# Patient Record
Sex: Female | Born: 1980 | Race: Asian | Hispanic: No | Marital: Married | Smoking: Never smoker
Health system: Southern US, Community
[De-identification: ages and names within clinical notes are randomized; demographics above are authoritative.]

## PROBLEM LIST (undated history)

## (undated) DIAGNOSIS — O019 Hydatidiform mole, unspecified: Secondary | ICD-10-CM

## (undated) DIAGNOSIS — IMO0002 Reserved for concepts with insufficient information to code with codable children: Secondary | ICD-10-CM

## (undated) DIAGNOSIS — C801 Malignant (primary) neoplasm, unspecified: Secondary | ICD-10-CM

## (undated) HISTORY — PX: CLEFT LIP REPAIR: SUR1164

## (undated) HISTORY — DX: Malignant (primary) neoplasm, unspecified: C80.1

## (undated) HISTORY — DX: Reserved for concepts with insufficient information to code with codable children: IMO0002

## (undated) HISTORY — PX: PORTACATH PLACEMENT: SHX2246

---

## 2010-09-22 DIAGNOSIS — C801 Malignant (primary) neoplasm, unspecified: Secondary | ICD-10-CM

## 2010-09-22 DIAGNOSIS — R87619 Unspecified abnormal cytological findings in specimens from cervix uteri: Secondary | ICD-10-CM

## 2010-09-22 DIAGNOSIS — IMO0002 Reserved for concepts with insufficient information to code with codable children: Secondary | ICD-10-CM

## 2010-09-22 HISTORY — DX: Unspecified abnormal cytological findings in specimens from cervix uteri: R87.619

## 2010-09-22 HISTORY — DX: Reserved for concepts with insufficient information to code with codable children: IMO0002

## 2010-09-22 HISTORY — PX: DILATION AND EVACUATION: SHX1459

## 2010-09-22 HISTORY — DX: Malignant (primary) neoplasm, unspecified: C80.1

## 2011-01-22 ENCOUNTER — Ambulatory Visit (HOSPITAL_COMMUNITY)
Admission: RE | Admit: 2011-01-22 | Discharge: 2011-01-22 | Disposition: A | Discharge status: Home / Self Care | Payer: PRIVATE HEALTH INSURANCE | Source: Ambulatory Visit | Attending: Obstetrics and Gynecology | Admitting: Obstetrics and Gynecology

## 2011-01-23 ENCOUNTER — Other Ambulatory Visit: Payer: Self-pay | Admitting: Obstetrics and Gynecology

## 2011-01-23 ENCOUNTER — Ambulatory Visit (HOSPITAL_COMMUNITY)
Admission: RE | Admit: 2011-01-23 | Discharge: 2011-01-23 | Disposition: A | Discharge status: Home / Self Care | Payer: PRIVATE HEALTH INSURANCE | Source: Ambulatory Visit | Attending: Obstetrics and Gynecology | Admitting: Obstetrics and Gynecology

## 2011-01-23 DIAGNOSIS — O019 Hydatidiform mole, unspecified: Secondary | ICD-10-CM | POA: Insufficient documentation

## 2011-01-30 NOTE — Op Note (Signed)
[name], [name]                   ACCOUNT NO.:  [id number]  MEDICAL RECORD NO.:  [id number]           PATIENT TYPE:  O  LOCATION:  [facility]                          [facility]  PHYSICIAN:  Janine Limbo, M.D.DATE OF BIRTH:  05-08-1981  DATE OF PROCEDURE:  01/23/2011 DATE OF DISCHARGE:                              OPERATIVE REPORT   PREOPERATIVE DIAGNOSIS:  Benign gestational trophoblastic neoplasia.  POSTOPERATIVE DIAGNOSIS:  Benign gestational trophoblastic neoplasia.  PROCEDURE:  Suction dilatation and evacuation.  SURGEON:  Janine Limbo, MD  FIRST ASSISTANT:  None.  ANESTHETIC:  Monitored anesthetic control and paracervical block using 0.5% Marcaine with epinephrine.  DISPOSITION:  [name] is a 30 year old female, gravida 1, para 0, who presents at 6 weeks' gestation ([facility] is August 07, 2011).  The patient has been followed at the [facility] and Gynecology Division of [facility].  The patient presented for her first exam, complaining of spotting of 1 week's duration.  An ultrasound was performed that confirmed what appeared to be a molar pregnancy.  The patient understands the indications for her surgical procedure and she accepts the risk of, but not limited to, anesthetic complications, bleeding, infections, and possible damage to the surrounding organs.  FINDINGS:  The uterus was approximately 12-week size.  A large amount of tissue was removed.  No adnexal masses were appreciated.  After the procedure was completed, the uterus was noted to be approximately 6-8 weeks size and firm.  The patient's preoperative hemoglobin was 12.7. Her hematocrit was 38.4%.  Her white blood cell count was 10,600.  Her platelet count was 226,000.  Her blood type is B positive.  Her quantitative beta HCG is 261,077.  DESCRIPTION OF PROCEDURE:  The patient was taken to the operating room where she was given medication through her IV  line.  She was placed in a lithotomy position.  The patient's lower abdomen, perineum, and vagina were prepped with multiple layers of Betadine.  The bladder was drained of urine.  The patient was then sterilely draped.  An examination under anesthesia was performed with findings as mentioned above.  A speculum was placed in the vagina.  A paracervical block was placed using 10 mL of 0.5% Marcaine with epinephrine.  The uterus sounded to 8.5 cm.  The cervix was gently dilated.  The uterine cavity was then evacuated using a size 8 suction curette followed by medium sharp curette.  The cavity was felt to be completely evacuated at the end of our procedure. Hemostasis was adequate.  The patient was given 15 mg of Toradol intramuscularly and 15 mg of Toradol intravenously.  Needle and sponge counts were correct.  The patient was returned to the supine position. Her anesthetic was reversed and the patient was transported to the recovery room in stable condition.  She tolerated her procedure well. The uterine contents were sent to Pathology for evaluation.  FOLLOWUP INSTRUCTIONS:  The patient will return to see Dr. Stefano Gaul in 1 week for followup examination.  A quantitative HCG will be obtained at that point.  The patient  will see Dr. Stefano Gaul on a weekly basis until her HCG value is negative.  The patient was given a copy of the followup information for patients who have undergone a dilatation and evacuation (prepared by the [facility]).  She will call for questions or concerns.  She was given a prescription for: 1. Motrin 800 mg every 8 hours as needed for mild-to-moderate pain. 2. Vicodin 1 or 2 tablets every 4-6 hours as needed for severe pain. 3. Phenergan 25 mg 1 tablet every 6 hours as needed for nausea.     Janine Limbo, M.D.     AVS/MEDQ  D:  01/23/2011  T:  01/24/2011  Job:  [id number]  Electronically Signed by Kirkland Hun M.D. on 01/30/2011  03:22:39 AM

## 2011-02-14 ENCOUNTER — Other Ambulatory Visit: Payer: Self-pay | Admitting: Obstetrics and Gynecology

## 2011-02-14 ENCOUNTER — Other Ambulatory Visit (HOSPITAL_COMMUNITY): Payer: Self-pay | Admitting: Obstetrics and Gynecology

## 2011-02-14 DIAGNOSIS — D496 Neoplasm of unspecified behavior of brain: Secondary | ICD-10-CM

## 2011-02-15 ENCOUNTER — Inpatient Hospital Stay (HOSPITAL_COMMUNITY)
Admission: AD | Admit: 2011-02-15 | Discharge: 2011-02-15 | Disposition: A | Discharge status: Home / Self Care | Payer: PRIVATE HEALTH INSURANCE | Source: Ambulatory Visit | Attending: Obstetrics and Gynecology | Admitting: Obstetrics and Gynecology

## 2011-02-15 ENCOUNTER — Other Ambulatory Visit (HOSPITAL_COMMUNITY): Payer: PRIVATE HEALTH INSURANCE

## 2011-02-15 DIAGNOSIS — D649 Anemia, unspecified: Secondary | ICD-10-CM | POA: Insufficient documentation

## 2011-02-15 DIAGNOSIS — R1011 Right upper quadrant pain: Secondary | ICD-10-CM | POA: Insufficient documentation

## 2011-02-15 DIAGNOSIS — O0289 Other abnormal products of conception: Secondary | ICD-10-CM | POA: Insufficient documentation
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
## 2011-02-18 ENCOUNTER — Other Ambulatory Visit: Payer: Self-pay | Admitting: Oncology

## 2011-02-18 ENCOUNTER — Encounter (HOSPITAL_BASED_OUTPATIENT_CLINIC_OR_DEPARTMENT_OTHER): Payer: PRIVATE HEALTH INSURANCE | Admitting: Oncology

## 2011-02-18 DIAGNOSIS — O019 Hydatidiform mole, unspecified: Secondary | ICD-10-CM

## 2011-02-18 DIAGNOSIS — Z5111 Encounter for antineoplastic chemotherapy: Secondary | ICD-10-CM

## 2011-02-18 LAB — COMPREHENSIVE METABOLIC PANEL
Alkaline Phosphatase: 68 U/L (ref 39–117)
CO2: 22 mEq/L (ref 19–32)
Creatinine, Ser: 0.49 mg/dL (ref 0.40–1.20)
Glucose, Bld: 93 mg/dL (ref 70–99)
Total Bilirubin: 0.4 mg/dL (ref 0.3–1.2)

## 2011-02-18 LAB — CBC WITH DIFFERENTIAL/PLATELET
BASO%: 0.5 % (ref 0.0–2.0)
Eosinophils Absolute: 0.2 10*3/uL (ref 0.0–0.5)
HCT: 34.3 % — ABNORMAL LOW (ref 34.8–46.6)
LYMPH%: 14.9 % (ref 14.0–49.7)
MCHC: 33.4 g/dL (ref 31.5–36.0)
MCV: 85.7 fL (ref 79.5–101.0)
MONO#: 0.5 10*3/uL (ref 0.1–0.9)
MONO%: 5.1 % (ref 0.0–14.0)
NEUT%: 78 % — ABNORMAL HIGH (ref 38.4–76.8)
Platelets: 255 10*3/uL (ref 145–400)
RBC: 4 10*6/uL (ref 3.70–5.45)
WBC: 10.5 10*3/uL — ABNORMAL HIGH (ref 3.9–10.3)

## 2011-02-19 ENCOUNTER — Other Ambulatory Visit: Payer: Self-pay | Admitting: Oncology

## 2011-02-19 DIAGNOSIS — O019 Hydatidiform mole, unspecified: Secondary | ICD-10-CM

## 2011-02-20 LAB — BETA HCG QUANT (REF LAB): Beta hCG, Tumor Marker: 399713 m[IU]/mL — ABNORMAL HIGH (ref <5.0)

## 2011-02-21 ENCOUNTER — Ambulatory Visit (HOSPITAL_COMMUNITY)
Admission: RE | Admit: 2011-02-21 | Discharge: 2011-02-21 | Disposition: A | Discharge status: Home / Self Care | Payer: PRIVATE HEALTH INSURANCE | Source: Ambulatory Visit | Attending: Oncology | Admitting: Oncology

## 2011-02-21 ENCOUNTER — Encounter (HOSPITAL_COMMUNITY): Payer: Self-pay

## 2011-02-21 DIAGNOSIS — J984 Other disorders of lung: Secondary | ICD-10-CM | POA: Insufficient documentation

## 2011-02-21 DIAGNOSIS — D649 Anemia, unspecified: Secondary | ICD-10-CM | POA: Insufficient documentation

## 2011-02-21 DIAGNOSIS — Z79899 Other long term (current) drug therapy: Secondary | ICD-10-CM | POA: Insufficient documentation

## 2011-02-21 DIAGNOSIS — K7689 Other specified diseases of liver: Secondary | ICD-10-CM | POA: Insufficient documentation

## 2011-02-21 DIAGNOSIS — I2699 Other pulmonary embolism without acute cor pulmonale: Secondary | ICD-10-CM | POA: Insufficient documentation

## 2011-02-21 DIAGNOSIS — R112 Nausea with vomiting, unspecified: Secondary | ICD-10-CM | POA: Insufficient documentation

## 2011-02-21 DIAGNOSIS — O019 Hydatidiform mole, unspecified: Secondary | ICD-10-CM | POA: Insufficient documentation

## 2011-02-21 DIAGNOSIS — O0289 Other abnormal products of conception: Secondary | ICD-10-CM | POA: Insufficient documentation

## 2011-02-21 DIAGNOSIS — C7800 Secondary malignant neoplasm of unspecified lung: Secondary | ICD-10-CM | POA: Insufficient documentation

## 2011-02-21 HISTORY — DX: Hydatidiform mole, unspecified: O01.9

## 2011-02-21 MED ORDER — IOHEXOL 300 MG/ML  SOLN
100.0000 mL | Freq: Once | INTRAMUSCULAR | Status: AC | PRN
  Administered 2011-02-21: 100 mL via INTRAVENOUS

## 2011-02-26 ENCOUNTER — Encounter (HOSPITAL_BASED_OUTPATIENT_CLINIC_OR_DEPARTMENT_OTHER): Payer: PRIVATE HEALTH INSURANCE | Admitting: Oncology

## 2011-02-26 ENCOUNTER — Other Ambulatory Visit: Payer: Self-pay | Admitting: Oncology

## 2011-02-26 ENCOUNTER — Inpatient Hospital Stay (HOSPITAL_COMMUNITY)
Admission: AD | Admit: 2011-02-26 | Discharge: 2011-02-28 | DRG: 846 | Disposition: A | Discharge status: Home / Self Care | Payer: PRIVATE HEALTH INSURANCE | Source: Ambulatory Visit | Attending: Oncology | Admitting: Oncology

## 2011-02-26 DIAGNOSIS — O020 Blighted ovum and nonhydatidiform mole: Secondary | ICD-10-CM

## 2011-02-26 DIAGNOSIS — O019 Hydatidiform mole, unspecified: Secondary | ICD-10-CM

## 2011-02-26 DIAGNOSIS — Z5111 Encounter for antineoplastic chemotherapy: Principal | ICD-10-CM

## 2011-02-26 DIAGNOSIS — C7800 Secondary malignant neoplasm of unspecified lung: Secondary | ICD-10-CM | POA: Diagnosis present

## 2011-02-26 DIAGNOSIS — I2699 Other pulmonary embolism without acute cor pulmonale: Secondary | ICD-10-CM | POA: Diagnosis present

## 2011-02-26 DIAGNOSIS — C55 Malignant neoplasm of uterus, part unspecified: Secondary | ICD-10-CM | POA: Diagnosis present

## 2011-02-26 LAB — CBC WITH DIFFERENTIAL/PLATELET
BASO%: 0.4 % (ref 0.0–2.0)
Basophils Absolute: 0 10*3/uL (ref 0.0–0.1)
Eosinophils Absolute: 0.1 10*3/uL (ref 0.0–0.5)
HCT: 33.9 % — ABNORMAL LOW (ref 34.8–46.6)
HGB: 11.4 g/dL — ABNORMAL LOW (ref 11.6–15.9)
MONO#: 0.4 10*3/uL (ref 0.1–0.9)
NEUT#: 6.4 10*3/uL (ref 1.5–6.5)
NEUT%: 78.5 % — ABNORMAL HIGH (ref 38.4–76.8)
Platelets: 214 10*3/uL (ref 145–400)
WBC: 8.2 10*3/uL (ref 3.9–10.3)
lymph#: 1.3 10*3/uL (ref 0.9–3.3)

## 2011-02-27 LAB — DIFFERENTIAL
Eosinophils Relative: 2 % (ref 0–5)
Lymphocytes Relative: 25 % (ref 12–46)
Lymphs Abs: 2 10*3/uL (ref 0.7–4.0)
Monocytes Absolute: 0.5 10*3/uL (ref 0.1–1.0)
Monocytes Relative: 7 % (ref 3–12)

## 2011-02-27 LAB — CBC
HCT: 30.8 % — ABNORMAL LOW (ref 36.0–46.0)
MCH: 27.6 pg (ref 26.0–34.0)
MCV: 83.2 fL (ref 78.0–100.0)
RDW: 12.5 % (ref 11.5–15.5)
WBC: 8.1 10*3/uL (ref 4.0–10.5)

## 2011-02-27 LAB — COMPREHENSIVE METABOLIC PANEL
Alkaline Phosphatase: 59 U/L (ref 39–117)
BUN: 6 mg/dL (ref 6–23)
Glucose, Bld: 88 mg/dL (ref 70–99)
Potassium: 3.7 mEq/L (ref 3.5–5.1)
Total Bilirubin: 0.2 mg/dL — ABNORMAL LOW (ref 0.3–1.2)
Total Protein: 6.2 g/dL (ref 6.0–8.3)

## 2011-02-27 LAB — URINALYSIS, ROUTINE W REFLEX MICROSCOPIC
Glucose, UA: NEGATIVE mg/dL
Protein, ur: NEGATIVE mg/dL
Urobilinogen, UA: 0.2 mg/dL (ref 0.0–1.0)

## 2011-02-27 LAB — URINE MICROSCOPIC-ADD ON

## 2011-02-28 LAB — CBC
MCH: 27.5 pg (ref 26.0–34.0)
MCV: 81.7 fL (ref 78.0–100.0)
Platelets: 226 10*3/uL (ref 150–400)
RDW: 12.4 % (ref 11.5–15.5)

## 2011-02-28 LAB — DIFFERENTIAL
Eosinophils Absolute: 0 10*3/uL (ref 0.0–0.7)
Eosinophils Relative: 0 % (ref 0–5)
Lymphs Abs: 0.8 10*3/uL (ref 0.7–4.0)
Monocytes Relative: 4 % (ref 3–12)

## 2011-03-02 NOTE — Discharge Summary (Signed)
[name], [name]                   ACCOUNT NO.:  [id number]  MEDICAL RECORD NO.:  [id number]  LOCATION:  1309                         FACILITY:  [facility]  PHYSICIAN:  Lowella Dell, M.D.DATE OF BIRTH:  May 02, 1981  DATE OF ADMISSION:  02/26/2011 DATE OF DISCHARGE:                              DISCHARGE SUMMARY   DISCHARGE DIAGNOSES: 1. Gestational trophoblastic neoplasia, high risk. 2. Poor venous access. 3. Anemia. 4. Mild protein malnutrition. 5. History of remote cleft palate repair.  PROCEDURES:  Intravenous chemotherapy.  HOSPITAL COURSE:  The patient was admitted on June 6 for her first dose of EMA-CO chemotherapy.  She had previously received one dose of methotrexate approximately 10 days prior.  Since that initial dose, CT scans obtained June 1 showed extensive involvement of the lungs.  No involvement of the brain and no obvious involvement in the pelvis.  The situation was discussed with the patient and she has agreed to be treated with standard of care which is the chemotherapy just described. She was admitted on June 6.  Unfortunately, the bed was not available until the evening, so the chemotherapy could not be started until June 7.  That day, she received Zofran 16 mg intravenously followed by Decadron 20 mg intravenously and then etoposide 100 mg per meter squared for a total dose of 140 mg intravenously followed by dactinomycin 0.5 mg, total dose IV push.  All those medications were repeated on day 2. On day 1 in addition, she received methotrexate 100 mg per meter squared for a total dose of 140 mg push, followed by methotrexate 200 mg per meter squared total dose 290 mg IV over 12 hours.  This was completed and approximately 1:30 in the morning of June 8.  The patient will start her folinic acid 15 mg at 1:30 p.m. of June 8 and she will repeat it at 10 p.m. that day and then the next day at 2 p.m. and 10 p.m. and then have a final dose on June 10 at 6  a.m.  The patient has tolerated treatment well.  There has been no phlebitis, nausea, vomiting or weakness.  The patient's only complaint is frequent urination secondary to the intravenous fluids.  Vital signs were stable through the hospitalization and at the time of this dictation, the patient's temperature is 98.2, pulse 73, respiratory rate 16 and blood pressure 90/59.  Her room air saturation is 98%.  The patient's CBC this morning shows a white cell count of 10.9, hemoglobin 10.5 and platelets 226,000.  Liver function tests obtained on June 7 were normal, except for slightly low bilirubin which is inconsequential. The creatinine was 0.47 and the electrolytes were normal.  The albumin was low at 2.9 with a calcium of 9.3.  The pretreatment beta HCG had risen to greater than 350,000 on June 8. Repeat beta HCG was already down to 180,000.  CONDITION ON DISCHARGE:  Stable.  DISCHARGE MEDICATIONS:  She will take leucovorin 15 mg as noted above. She will have a prescription for metoclopramide 5 mg to take 3 times a day before meals as needed and she will have a prescription for Emla cream to use  once her port is placed.  Unfortunately, that could not be done before her discharge today because the patient has already had breakfast.  She may also take Advil for pain as needed.  Return to work is as tolerated.  Activity is unrestricted.  Diet is unrestricted.  FOLLOWUP APPOINTMENTS:  The patient has an appointment with [place] at the [facility] on June 13 at 8:30 in the morning for her second part of first cycle of chemotherapy.     Lowella Dell, M.D.     [name]  D:  02/28/2011  T:  02/28/2011  Job:  [id number]  cc:   Janine Limbo, M.D. Fax: [phone]  Laurette Schimke, MD  Electronically Signed by Ruthann Cancer M.D. on 03/02/2011 11:45:58 AM

## 2011-03-02 NOTE — H&P (Signed)
[name]                   ACCOUNT NO.:  [id number]  MEDICAL RECORD NO.:  [id number]  LOCATION:  1309                         FACILITY:  [facility]  PHYSICIAN:  Lowella Dell, M.D.DATE OF BIRTH:  [birth year]  DATE OF ADMISSION:  02/26/2011 DATE OF DISCHARGE:                             HISTORY & PHYSICAL   HISTORY OF PRESENT ILLNESS:  [name] is a 30 year old [place] woman who is originally from [name].  She was referred to Dr. Darnelle Catalan by Dr. Stefano Gaul  after she was found to have a molar pregnancy.  She is status post first trimester dilatation and evacuation on Jan 23, 2011. Pathology [id number]) showed an 11-week molar pregnancy.  Preoperative beta HCG was 261,077.  This was repeated 2 weeks postop and was down to 88,955.  It then, however, began to increase and by May 26 was back up to 236,711.  She was evaluated at the cancer center outpatient clinic by Dr. Darnelle Catalan on Feb 18, 2011 and was actually started on methotrexate and leucovorin.  Subsequent staging scans, however, were obtained on February 21, 2011.  These consisted of a CT of the head, chest, abdomen and pelvis.  CT of the head was unremarkable with no intracranial metastasis.  CT of the chest, however, showed multiple bilateral pulmonary nodules consistent with metastatic gestational trophoblastic disease.  There was a large left hilar nodule, more irregular; also a small acute pulmonary embolism within the right lower lobe although the overall clot burden appeared to be very minimal.  CT of the abdomen and pelvis showed a 3-mm hypodensity within the right hepatic lobe which was too small to characterize.  Of course there was also hyperemic uterus related to the recent pregnancy.  Based on this new information, the decision was made to treat [name] with the EMA-CO protocol and of course this will have to be done on an inpatient basis.  With the exception of some occasional nausea and emesis, primarily  in the mornings, [name] is feeling well and has no additional complaints today.  She has had no vaginal bleeding whatsoever.  ALLERGIES:  No known drug allergies.  CURRENT MEDICATIONS:  None.  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  FAMILY HISTORY:  Noncontributory.  SOCIAL HISTORY:  The patient is an immigrant from [name] who lives here with her husband, her parents, her brother, sister-in-law, and her brother's child.  PHYSICAL EXAMINATION:  GENERAL:  Well-appearing appearing her stated age of 39, in no acute distress. VITAL SIGNS:  Blood pressure 118/81, pulse 94, respirations 20, temperature 98.8, height 58.5 inches and weight 112.9 pounds.  HEENT: Sclerae anicteric.  Conjunctivae pink.  Oropharynx is benign. NECK:  Supple. NODES:  No peripheral lymphadenopathy palpated. BREASTS:  Deferred. LUNGS:  Clear to auscultation bilaterally with no frank crackles, wheezes or rhonchi auscultated. HEART:  Regular rate and rhythm with no gallops, murmurs or rubs. ABDOMEN:  Soft, mildly distended but nontender to palpation.  Positive bowel sounds x4.  No organomegaly or masses palpated. MUSCULOSKELETAL:  No focal spinal tenderness to palpation. EXTREMITIES:  No peripheral edema. SKIN:  Benign. NEURO:  Nonfocal.  The  patient is alert and oriented x3. GENITOURINARY:  Deferred.  REVIEW OF SYSTEMS:  As per HPI.  [name] primary complaint at this time is some continued mild nausea and occasional emesis, primarily in the mornings.  She denies any nausea here in the office today.  She has had no recent fevers, chills, night sweats, rashes or abnormal bleeding.  No vaginal bleeding.  No diarrhea or constipation.  No change in urinary habits.  No cough, phlegm production, pleurisy or shortness of breath. No chest pain or palpitations.  No abnormal headaches or dizziness.  She denies any myalgias, arthralgias bony pain or peripheral  swelling.  She has had no abdominal or pelvic pain.  LABORATORY DATA:  White count 8.2, ANC 6.4, hemoglobin 11.4 and platelets 214,000.  A CMET and beta HCG are both pending.  FILMS/STUDIES:  As noted above, CT of the head, chest, abdomen and pelvis were obtained on February 21, 2011; results were detailed above in HPI.  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  Status post first trimester dilatation and evacuation under the care of Dr. Kirkland Hun on Jan 23, 2011, pathology showing an 11-week molar pregnancy.  There was an initial drop in beta HCG but then a subsequent rise to 236,711 on Feb 15, 2011. Subsequent CTs of the head, chest, abdomen and pelvis on February 21, 2011, confirmed multiple bilateral pulmonary nodules, consistent with metastatic gestational trophoblastic disease.  The patient is now being admitted to [facility] for chemo to be given on inpatient basis.  She will be receiving EMA-C O protocol. On day #1, she will receive etoposide at a dose of 100 mg/sq m IV over 30 minutes; actinomycin D 0.5 mg IVP; and methotrexate at a dose of 100 mg/sq m IVP, then 200 mg/sq m given in 500 mL of D5W over 12 hours.  On day #2, she will receive etoposide 100 mg/sq m IV over 30 minutes; actinomycin D 0.5 mg IVP; and folinic acid 15 mg IM or 15 mg p.o. q.12 h x4 doses beginning 24 hours after the start of methotrexate.  Of note, the patient will be returning to the cancer center on day #8 of each cycle for outpatient chemo consisting of cyclophosphamide at a dose of 600 mg/sq m IV with vincristine 1 mg/sq m IVP.  She will also be receiving premeds on day #1 and #2 consisting of ondansetron 16 mg and dexamethasone 20 mg.  Our plan is to begin [name] chemo tomorrow, February 27, 2011; and hopefully we will be able to discharge her the following  evening on February 28, 2011, and then she will return to see Dr. Darnelle Catalan to initiate her day #8 chemo in the outpatient clinic on March 05, 2011.  This admission and plan has been reviewed with Dr. Darnelle Catalan who voices agreement.     Jillian Allegra Grana, PA-C   ______________________________ Lowella Dell, M.D.    AGB/MEDQ  D:  02/26/2011  T:  02/26/2011  Job:  [id number]  cc:   Laurette Schimke, MD  Janine Limbo, M.D. Fax: [phone]  Electronically Signed by Zollie Scale PA on 02/27/2011 08:46:44 AM Electronically Signed by Ruthann Cancer M.D. on 03/02/2011 11:45:56 AM

## 2011-03-03 LAB — COMPREHENSIVE METABOLIC PANEL
AST: 17 U/L (ref 0–37)
Alkaline Phosphatase: 64 U/L (ref 39–117)
BUN: 10 mg/dL (ref 6–23)
Calcium: 9 mg/dL (ref 8.4–10.5)
Chloride: 107 mEq/L (ref 96–112)
Creatinine, Ser: 0.55 mg/dL (ref 0.50–1.10)

## 2011-03-04 ENCOUNTER — Ambulatory Visit (HOSPITAL_COMMUNITY)
Admit: 2011-03-04 | Discharge: 2011-03-04 | Disposition: A | Discharge status: Home / Self Care | Payer: PRIVATE HEALTH INSURANCE | Attending: Oncology | Admitting: Oncology

## 2011-03-04 ENCOUNTER — Other Ambulatory Visit (HOSPITAL_COMMUNITY): Payer: Self-pay | Admitting: Oncology

## 2011-03-04 ENCOUNTER — Ambulatory Visit (HOSPITAL_COMMUNITY): Admission: RE | Admit: 2011-03-04 | Payer: PRIVATE HEALTH INSURANCE | Source: Ambulatory Visit | Admitting: Oncology

## 2011-03-04 DIAGNOSIS — O020 Blighted ovum and nonhydatidiform mole: Secondary | ICD-10-CM

## 2011-03-04 DIAGNOSIS — Z79899 Other long term (current) drug therapy: Secondary | ICD-10-CM | POA: Insufficient documentation

## 2011-03-04 DIAGNOSIS — O0289 Other abnormal products of conception: Secondary | ICD-10-CM | POA: Insufficient documentation

## 2011-03-05 ENCOUNTER — Encounter (HOSPITAL_BASED_OUTPATIENT_CLINIC_OR_DEPARTMENT_OTHER): Payer: PRIVATE HEALTH INSURANCE | Admitting: Oncology

## 2011-03-05 ENCOUNTER — Other Ambulatory Visit: Payer: Self-pay | Admitting: Oncology

## 2011-03-05 DIAGNOSIS — O019 Hydatidiform mole, unspecified: Secondary | ICD-10-CM

## 2011-03-05 DIAGNOSIS — Z5111 Encounter for antineoplastic chemotherapy: Secondary | ICD-10-CM

## 2011-03-05 LAB — CBC WITH DIFFERENTIAL/PLATELET
BASO%: 0.3 % (ref 0.0–2.0)
Basophils Absolute: 0 10*3/uL (ref 0.0–0.1)
HCT: 32.2 % — ABNORMAL LOW (ref 34.8–46.6)
HGB: 10.9 g/dL — ABNORMAL LOW (ref 11.6–15.9)
LYMPH%: 12.9 % — ABNORMAL LOW (ref 14.0–49.7)
MCHC: 33.8 g/dL (ref 31.5–36.0)
MONO#: 0 10*3/uL — ABNORMAL LOW (ref 0.1–0.9)
NEUT%: 85.5 % — ABNORMAL HIGH (ref 38.4–76.8)
Platelets: 196 10*3/uL (ref 145–400)
WBC: 7.2 10*3/uL (ref 3.9–10.3)

## 2011-03-08 LAB — COMPREHENSIVE METABOLIC PANEL
ALT: 16 U/L (ref 0–35)
BUN: 14 mg/dL (ref 6–23)
CO2: 24 mEq/L (ref 19–32)
Calcium: 9.6 mg/dL (ref 8.4–10.5)
Creatinine, Ser: 0.62 mg/dL (ref 0.50–1.10)
Glucose, Bld: 101 mg/dL — ABNORMAL HIGH (ref 70–99)
Total Bilirubin: 0.7 mg/dL (ref 0.3–1.2)

## 2011-03-08 LAB — BETA HCG QUANT (REF LAB): Beta hCG, Tumor Marker: 199560 m[IU]/mL — ABNORMAL HIGH (ref <5.0)

## 2011-03-12 ENCOUNTER — Other Ambulatory Visit: Payer: Self-pay | Admitting: Oncology

## 2011-03-12 ENCOUNTER — Encounter (HOSPITAL_BASED_OUTPATIENT_CLINIC_OR_DEPARTMENT_OTHER): Payer: PRIVATE HEALTH INSURANCE | Admitting: Oncology

## 2011-03-12 ENCOUNTER — Inpatient Hospital Stay (HOSPITAL_COMMUNITY)
Admission: AD | Admit: 2011-03-12 | Discharge: 2011-03-13 | DRG: 847 | Disposition: A | Discharge status: Home / Self Care | Payer: PRIVATE HEALTH INSURANCE | Source: Ambulatory Visit | Attending: Oncology | Admitting: Oncology

## 2011-03-12 DIAGNOSIS — C58 Malignant neoplasm of placenta: Secondary | ICD-10-CM | POA: Diagnosis present

## 2011-03-12 DIAGNOSIS — Z5111 Encounter for antineoplastic chemotherapy: Principal | ICD-10-CM

## 2011-03-12 DIAGNOSIS — D759 Disease of blood and blood-forming organs, unspecified: Secondary | ICD-10-CM | POA: Diagnosis present

## 2011-03-12 DIAGNOSIS — O0289 Other abnormal products of conception: Secondary | ICD-10-CM

## 2011-03-12 DIAGNOSIS — O019 Hydatidiform mole, unspecified: Secondary | ICD-10-CM

## 2011-03-12 DIAGNOSIS — D649 Anemia, unspecified: Secondary | ICD-10-CM | POA: Diagnosis present

## 2011-03-12 DIAGNOSIS — C7800 Secondary malignant neoplasm of unspecified lung: Secondary | ICD-10-CM | POA: Diagnosis present

## 2011-03-12 LAB — CBC WITH DIFFERENTIAL/PLATELET
BASO%: 0.6 % (ref 0.0–2.0)
EOS%: 3.4 % (ref 0.0–7.0)
MCH: 28.3 pg (ref 25.1–34.0)
MCHC: 34.2 g/dL (ref 31.5–36.0)
MONO#: 0.3 10*3/uL (ref 0.1–0.9)
RDW: 11.8 % (ref 11.2–14.5)
WBC: 2.6 10*3/uL — ABNORMAL LOW (ref 3.9–10.3)
lymph#: 0.9 10*3/uL (ref 0.9–3.3)

## 2011-03-12 LAB — URINALYSIS, ROUTINE W REFLEX MICROSCOPIC
Bilirubin Urine: NEGATIVE
Ketones, ur: NEGATIVE mg/dL
Nitrite: NEGATIVE
Protein, ur: NEGATIVE mg/dL
Urobilinogen, UA: 1 mg/dL (ref 0.0–1.0)
pH: 6.5 (ref 5.0–8.0)

## 2011-03-12 LAB — COMPREHENSIVE METABOLIC PANEL
ALT: 40 U/L — ABNORMAL HIGH (ref 0–35)
AST: 19 U/L (ref 0–37)
Albumin: 3.6 g/dL (ref 3.5–5.2)
CO2: 25 mEq/L (ref 19–32)
Calcium: 9.4 mg/dL (ref 8.4–10.5)
Chloride: 99 mEq/L (ref 96–112)
Potassium: 3.2 mEq/L — ABNORMAL LOW (ref 3.5–5.3)
Sodium: 133 mEq/L — ABNORMAL LOW (ref 135–145)
Total Protein: 6.5 g/dL (ref 6.0–8.3)

## 2011-03-12 LAB — URINE MICROSCOPIC-ADD ON

## 2011-03-13 LAB — DIFFERENTIAL
Eosinophils Relative: 0 % (ref 0–5)
Lymphocytes Relative: 26 % (ref 12–46)
Lymphs Abs: 0.4 10*3/uL — ABNORMAL LOW (ref 0.7–4.0)
Monocytes Absolute: 0 10*3/uL — ABNORMAL LOW (ref 0.1–1.0)

## 2011-03-13 LAB — CBC
HCT: 27.5 % — ABNORMAL LOW (ref 36.0–46.0)
MCHC: 33.1 g/dL (ref 30.0–36.0)
MCV: 82.1 fL (ref 78.0–100.0)
RDW: 12 % (ref 11.5–15.5)
WBC: 1.6 10*3/uL — ABNORMAL LOW (ref 4.0–10.5)

## 2011-03-13 NOTE — H&P (Signed)
NAMEPATRIZIA, [name]                   ACCOUNT NO.:  [id number]  MEDICAL RECORD NO.:  [id number]  LOCATION:  1342                         FACILITY:  [facility]  PHYSICIAN:  Lowella Dell, M.D.DATE OF BIRTH:  04-Dec-1980  DATE OF ADMISSION:  03/12/2011 DATE OF DISCHARGE:                             HISTORY & PHYSICAL   HISTORY OF PRESENT ILLNESS:  [name] is a 30 year old woman from [place], currently residing in [place], who was referred to Dr. Darnelle Catalan by Dr. Stefano Gaul with the diagnosis of molar pregnancy.  She underwent a first trimester dilatation and evacuation on Jan 23, 2011 with pathology ([id number]) showing an 11-week molar pregnancy. Preoperative beta HCG was 261077, most recently down to 199560 on March 05, 2011.  Following evaluation by Dr. Darnelle Catalan on Feb 18, 2011, patient was started on methotrexate and leucovorin.  Staging scans, however, on February 21, 2011 showed evidence of multiple bilateral pulmonary nodules consistent with metastatic gestational trophoblastic disease.  [name] is currently receiving EMA - CO given on an inpatient basis.  She is due for her second cycle to begin today.  Overall, [name] tells me she is doing extremely well with the exception of some mild fatigue and some mild back pain, which is intermittent. She has had no recent fevers or chills.  No nausea or emesis.  No signs of abnormal bleeding, specifically no vaginal bleeding.  No abdominal or pelvic pain noted.  No shortness of breath, cough, phlegm production or pleurisy.  ALLERGIES:  No known drug allergies.  CURRENT MEDICATIONS:  Advil as needed for pain.  PAST MEDICAL HISTORY: 1. Gestational trophoblastic neoplasia, high risk, as per HPI. 2. Anemia. 3. History of protein malnutrition. 4. History of remote cleft palate repair.  FAMILY HISTORY:  Noncontributory.  SOCIAL HISTORY:  Patient is an immigrant from [place], currently residing in [place] with her husband,  parents, brother, sister-in-law and her brother's child.  PHYSICAL EXAMINATION:  GENERAL:  Well-appearing female appearing her stated age of 46, in no acute distress. VITAL SIGNS:  Blood pressure 116/73, pulse 116, respirations 20, temperature 99.0, weight 112.9, height 58.5 inches. HEENT:  Sclerae anicteric.  Conjunctivae pink.  Oropharynx clear with no mucositis or candidiasis noted. NECK:  Supple.  Trachea midline.  Notes no peripheral lymphadenopathy. BREAST EXAMINATION:  Deferred. LUNGS:  Clear to auscultation bilaterally.  No crackles, wheezes or rhonchi. HEART:  Regular rate and rhythm. ABDOMEN:  Soft, nontender to palpation with normal bowel sounds.  No organomegaly or masses palpated. GENITOURINARY:  Deferred. MUSCULOSKELETAL:  No focal spinal tenderness to vigorous palpation and percussion. EXTREMITIES:  No peripheral edema. SKIN:  Benign. NEUROLOGIC:  Nonfocal.  Patient alert and oriented x3.  REVIEW OF SYSTEMS:  As per HPI, Ms. Abe only complains of currently some mild fatigue and mild lower back pain.  She denies any fevers, chills, night sweats, rashes or abnormal bleeding, specifically no vaginal bleeding.  No nausea, emesis, diarrhea or constipation.  No change in urinary habits.  No cough, phlegm production, pleurisy, shortness of breath, chest pain or palpitations.  No abnormal headaches, dizziness or change in vision other than mild back pain, which is intermittent.  No additional myalgias, arthralgias or bony pain.  No peripheral swelling. No peripheral neuropathy.  No abdominal or pelvic pain.  LABORATORY DATA:  White count today 2.6, ANC 1.3, hemoglobin 9.6, hematocrit 28.2, MCV 82.9 and platelets 235,000.  A CMET showed a low sodium of 133, normal chloride of 99, low potassium of 3.2, nonfasting glucose 113, normal serum creatinine of 0.57 with normal BUN of 12, total bilirubin slightly low at 0.2, ALT elevated at 40 with a normal AST of 19 and normal  alkaline phosphatase of 66, total protein normal at 6.5, total albumin normal at 3.6 and total calcium normal at 9.40.  the beta HCG is currently pending.  IMPRESSION AND PLAN:  [name] is a 30 year old [place] woman originally from [place] with a diagnosis of high risk gestational trophoblastic neoplasia with evidence of spread to the lungs, status post first trimester dilatation and evacuation in the care of Dr. Kirkland Hun on Jan 23, 2011 with pathology showing an 11-week molar pregnancy.  There was an initial drop in beta HCG, but then a subsequent rise to 236,711 on Feb 15, 2011.  Subsequent CTs of the head, chest, abdomen and pelvis on February 21, 2011 confirmed multiple bilateral pulmonary nodules consistent with metastatic gestational trophoblastic disease.  Patient is now being treated with EMA - CO protocol, due for day I cycle II today given on an inpatient basis.  Patient is being admitted to [facility] for her second cycle of EMA - CO.  On day 1, she will receive etoposide at a dose of 100 mg per meter square intravenous over 30 minutes, actinomycin D 0.5 mg IVP and methotrexate at a dose of 100 mg per meter square IVP, then 200 mg per meter square given in 500 mL of D5W over 12 hours.  On day 2, she will receive etoposide at 100 mg per square meter intravenous over 30 minutes, actinomycin D 0.5 mg IVP and folinic acid 15 mg IM or 15 mg p.o. q.12h. x4 doses beginning 24 hours after the start of methotrexate.  Patient's plan is to return to the cancer center on day 8, March 19, 2011 for outpatient chemo, which will consist of cyclophosphamide at 600 mg per meter square intravenous with vincristine 1 mg per meter square IVP in addition.  The long-term goal is to bring the beta HCG to normal range then continue treatment for an additional 6 weeks at which point we would be given observation.  This case admission and plan were all reviewed with Dr. Ruthann Cancer, who will be following the patient during admission.     Jillian Allegra Grana, PA-C   ______________________________ [place] Dell, M.D.    AGB/MEDQ  D:  03/12/2011  T:  03/12/2011  Job:  [id number]  Electronically Signed by Jillian BERRY PA on 03/12/2011 04:21:47 PM Electronically Signed by Ruthann Cancer M.D. on 03/13/2011 08:19:57 AM

## 2011-03-19 ENCOUNTER — Encounter (HOSPITAL_BASED_OUTPATIENT_CLINIC_OR_DEPARTMENT_OTHER): Payer: PRIVATE HEALTH INSURANCE | Admitting: Oncology

## 2011-03-19 ENCOUNTER — Other Ambulatory Visit: Payer: Self-pay | Admitting: Oncology

## 2011-03-19 DIAGNOSIS — O019 Hydatidiform mole, unspecified: Secondary | ICD-10-CM

## 2011-03-19 DIAGNOSIS — Z5111 Encounter for antineoplastic chemotherapy: Secondary | ICD-10-CM

## 2011-03-19 LAB — COMPREHENSIVE METABOLIC PANEL
BUN: 9 mg/dL (ref 6–23)
CO2: 25 mEq/L (ref 19–32)
Creatinine, Ser: <0.47 mg/dL — ABNORMAL LOW (ref 0.50–1.10)
Glucose, Bld: 124 mg/dL — ABNORMAL HIGH (ref 70–99)
Total Bilirubin: 0.3 mg/dL (ref 0.3–1.2)

## 2011-03-19 LAB — CBC WITH DIFFERENTIAL/PLATELET
Eosinophils Absolute: 0.1 10*3/uL (ref 0.0–0.5)
LYMPH%: 38.9 % (ref 14.0–49.7)
MCV: 81.2 fL (ref 79.5–101.0)
MONO%: 6.6 % (ref 0.0–14.0)
NEUT#: 1.2 10*3/uL — ABNORMAL LOW (ref 1.5–6.5)
NEUT%: 50.6 % (ref 38.4–76.8)
Platelets: 266 10*3/uL (ref 145–400)
RBC: 3.56 10*6/uL — ABNORMAL LOW (ref 3.70–5.45)
nRBC: 0 % (ref 0–0)

## 2011-03-22 LAB — BETA HCG QUANT (REF LAB): Beta hCG, Tumor Marker: 2477.7 m[IU]/mL — ABNORMAL HIGH (ref <5.0)

## 2011-03-25 ENCOUNTER — Inpatient Hospital Stay (HOSPITAL_COMMUNITY)
Admission: AD | Admit: 2011-03-25 | Discharge: 2011-03-27 | DRG: 847 | Disposition: A | Discharge status: Home / Self Care | Payer: PRIVATE HEALTH INSURANCE | Source: Ambulatory Visit | Attending: Oncology | Admitting: Oncology

## 2011-03-25 ENCOUNTER — Other Ambulatory Visit: Payer: Self-pay | Admitting: Oncology

## 2011-03-25 ENCOUNTER — Encounter (HOSPITAL_BASED_OUTPATIENT_CLINIC_OR_DEPARTMENT_OTHER): Payer: PRIVATE HEALTH INSURANCE | Admitting: Oncology

## 2011-03-25 DIAGNOSIS — C7800 Secondary malignant neoplasm of unspecified lung: Secondary | ICD-10-CM | POA: Diagnosis present

## 2011-03-25 DIAGNOSIS — C58 Malignant neoplasm of placenta: Secondary | ICD-10-CM | POA: Diagnosis present

## 2011-03-25 DIAGNOSIS — Z5111 Encounter for antineoplastic chemotherapy: Secondary | ICD-10-CM

## 2011-03-25 DIAGNOSIS — O0289 Other abnormal products of conception: Secondary | ICD-10-CM

## 2011-03-25 DIAGNOSIS — D72819 Decreased white blood cell count, unspecified: Secondary | ICD-10-CM | POA: Diagnosis not present

## 2011-03-25 DIAGNOSIS — O019 Hydatidiform mole, unspecified: Secondary | ICD-10-CM

## 2011-03-25 DIAGNOSIS — E876 Hypokalemia: Secondary | ICD-10-CM | POA: Diagnosis not present

## 2011-03-25 DIAGNOSIS — T451X5A Adverse effect of antineoplastic and immunosuppressive drugs, initial encounter: Secondary | ICD-10-CM | POA: Diagnosis not present

## 2011-03-25 DIAGNOSIS — D6481 Anemia due to antineoplastic chemotherapy: Secondary | ICD-10-CM | POA: Diagnosis not present

## 2011-03-25 LAB — CBC WITH DIFFERENTIAL/PLATELET
Basophils Absolute: 0.1 10*3/uL (ref 0.0–0.1)
HCT: 25.7 % — ABNORMAL LOW (ref 34.8–46.6)
HGB: 8.5 g/dL — ABNORMAL LOW (ref 11.6–15.9)
MONO#: 0.4 10*3/uL (ref 0.1–0.9)
NEUT#: 1.8 10*3/uL (ref 1.5–6.5)
NEUT%: 59 % (ref 38.4–76.8)
RDW: 12.7 % (ref 11.2–14.5)
WBC: 3.1 10*3/uL — ABNORMAL LOW (ref 3.9–10.3)
lymph#: 0.8 10*3/uL — ABNORMAL LOW (ref 0.9–3.3)

## 2011-03-25 LAB — COMPREHENSIVE METABOLIC PANEL
AST: 34 U/L (ref 0–37)
Albumin: 3.4 g/dL — ABNORMAL LOW (ref 3.5–5.2)
BUN: 10 mg/dL (ref 6–23)
Calcium: 9.5 mg/dL (ref 8.4–10.5)
Chloride: 103 mEq/L (ref 96–112)
Glucose, Bld: 124 mg/dL — ABNORMAL HIGH (ref 70–99)
Potassium: 3 mEq/L — ABNORMAL LOW (ref 3.5–5.3)
Sodium: 138 mEq/L (ref 135–145)
Total Protein: 6.5 g/dL (ref 6.0–8.3)

## 2011-03-26 LAB — URINALYSIS, ROUTINE W REFLEX MICROSCOPIC
Bilirubin Urine: NEGATIVE
Hgb urine dipstick: NEGATIVE
Nitrite: NEGATIVE
Protein, ur: NEGATIVE mg/dL
Specific Gravity, Urine: 1.028 (ref 1.005–1.030)
Urobilinogen, UA: 1 mg/dL (ref 0.0–1.0)

## 2011-03-26 LAB — DIFFERENTIAL
Basophils Absolute: 0.1 10*3/uL (ref 0.0–0.1)
Basophils Relative: 3 % — ABNORMAL HIGH (ref 0–1)
Eosinophils Absolute: 0 10*3/uL (ref 0.0–0.7)
Lymphocytes Relative: 31 % (ref 12–46)
Monocytes Absolute: 0.3 10*3/uL (ref 0.1–1.0)
Neutro Abs: 1.1 10*3/uL — ABNORMAL LOW (ref 1.7–7.7)

## 2011-03-26 LAB — CBC
HCT: 23.4 % — ABNORMAL LOW (ref 36.0–46.0)
Hemoglobin: 7.9 g/dL — ABNORMAL LOW (ref 12.0–15.0)
MCH: 27.8 pg (ref 26.0–34.0)
MCHC: 33.8 g/dL (ref 30.0–36.0)
MCV: 82.4 fL (ref 78.0–100.0)
RDW: 12.6 % (ref 11.5–15.5)

## 2011-03-27 LAB — CBC
Hemoglobin: 7.6 g/dL — ABNORMAL LOW (ref 12.0–15.0)
MCH: 27.4 pg (ref 26.0–34.0)
MCHC: 33.3 g/dL (ref 30.0–36.0)
RDW: 12.8 % (ref 11.5–15.5)

## 2011-03-27 LAB — URINALYSIS, ROUTINE W REFLEX MICROSCOPIC
Bilirubin Urine: NEGATIVE
Hgb urine dipstick: NEGATIVE
Nitrite: NEGATIVE
Protein, ur: NEGATIVE mg/dL
Specific Gravity, Urine: 1.011 (ref 1.005–1.030)
Urobilinogen, UA: 0.2 mg/dL (ref 0.0–1.0)

## 2011-03-27 LAB — TYPE AND SCREEN: ABO/RH(D): B POS

## 2011-03-27 LAB — DIFFERENTIAL
Basophils Absolute: 0 10*3/uL (ref 0.0–0.1)
Basophils Relative: 0 % (ref 0–1)
Eosinophils Absolute: 0 10*3/uL (ref 0.0–0.7)
Eosinophils Relative: 0 % (ref 0–5)
Monocytes Absolute: 0.4 10*3/uL (ref 0.1–1.0)
Monocytes Relative: 15 % — ABNORMAL HIGH (ref 3–12)
Neutro Abs: 1.8 10*3/uL (ref 1.7–7.7)

## 2011-03-27 LAB — BASIC METABOLIC PANEL
Calcium: 8 mg/dL — ABNORMAL LOW (ref 8.4–10.5)
GFR calc Af Amer: >60 mL/min (ref >60)
GFR calc non Af Amer: >60 mL/min (ref >60)
Glucose, Bld: 168 mg/dL — ABNORMAL HIGH (ref 70–99)
Potassium: 3.3 mEq/L — ABNORMAL LOW (ref 3.5–5.1)
Sodium: 135 mEq/L (ref 135–145)

## 2011-03-28 LAB — BETA HCG QUANT (REF LAB): Beta hCG, Tumor Marker: 389.5 m[IU]/mL — ABNORMAL HIGH (ref <5.0)

## 2011-04-02 ENCOUNTER — Encounter (HOSPITAL_BASED_OUTPATIENT_CLINIC_OR_DEPARTMENT_OTHER): Payer: PRIVATE HEALTH INSURANCE | Admitting: Oncology

## 2011-04-02 ENCOUNTER — Other Ambulatory Visit: Payer: Self-pay | Admitting: Physician Assistant

## 2011-04-02 DIAGNOSIS — K1370 Unspecified lesions of oral mucosa: Secondary | ICD-10-CM

## 2011-04-02 DIAGNOSIS — Z5111 Encounter for antineoplastic chemotherapy: Secondary | ICD-10-CM

## 2011-04-02 DIAGNOSIS — L738 Other specified follicular disorders: Secondary | ICD-10-CM

## 2011-04-02 DIAGNOSIS — O019 Hydatidiform mole, unspecified: Secondary | ICD-10-CM

## 2011-04-02 LAB — COMPREHENSIVE METABOLIC PANEL
ALT: 34 U/L (ref 0–35)
AST: 22 U/L (ref 0–37)
Alkaline Phosphatase: 60 U/L (ref 39–117)
Creatinine, Ser: 0.64 mg/dL (ref 0.50–1.10)
Sodium: 137 mEq/L (ref 135–145)
Total Bilirubin: 0.3 mg/dL (ref 0.3–1.2)
Total Protein: 7 g/dL (ref 6.0–8.3)

## 2011-04-02 LAB — CBC WITH DIFFERENTIAL/PLATELET
BASO%: 0.4 % (ref 0.0–2.0)
Basophils Absolute: 0 10*3/uL (ref 0.0–0.1)
EOS%: 2 % (ref 0.0–7.0)
HCT: 26.5 % — ABNORMAL LOW (ref 34.8–46.6)
HGB: 8.8 g/dL — ABNORMAL LOW (ref 11.6–15.9)
MONO#: 0.1 10*3/uL (ref 0.1–0.9)
MONO%: 4.8 % (ref 0.0–14.0)
NEUT%: 65.5 % (ref 38.4–76.8)
Platelets: 229 10*3/uL (ref 145–400)
WBC: 2.5 10*3/uL — ABNORMAL LOW (ref 3.9–10.3)
lymph#: 0.7 10*3/uL — ABNORMAL LOW (ref 0.9–3.3)
nRBC: 0 % (ref 0–0)

## 2011-04-04 NOTE — H&P (Addendum)
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  Ms. Geurin was referred to Dr. Darnelle Catalan by Dr. Stefano Gaul having been found to have a molar pregnancy.  She is status post first trimester dilatation and evacuation on Jan 23, 2011 with pathology showing an 36- week molar pregnancy ([id number]-12 [id number]).  Preoperative beta HCG was 261,077.  Repeated 2 weeks postop, down to 88,955, but then increasing once again.  By Feb 15, 2011, beta HCG was back up to 236,711.  After evaluation by Dr. Darnelle Catalan, she was started on methotrexate and leucovorin.  Subsequent staging scans, however, obtained on February 21, 2011 showed multiple bilateral pulmonary nodules consistent with metastatic gestational trophoblastic disease.  There was a large left hilar nodule more of irregular as well as a small acute pulmonary embolism within the right lower lobe, the overall clot burden appearing to be very minimal.  Based on this information, Ms. Forrer she has been receiving EMA - CO chemotherapy, with day 1 and 2 given as an inpatient, day 8 given as an outpatient at the [facility].  She is status post two cycles of EMA - CO, which she has tolerated extremely well and is due to initiate day 1 cycle III tomorrow, March 26, 2011.  Overall, Alorah is feeling well.  She does have some occasional lower back pain for which she takes ibuprofen occasionally.  She denies any recent nausea or emesis.  She has had no abdominal or pelvic pain.  She denies any  vaginal bleeding and in fact a detailed review of systems is otherwise unremarkable.  ALLERGIES:  No known drug allergies.  PAST MEDICAL HISTORY: 1. 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 2. Remote cleft palate repair. 3. History of protein malnutrition. 4. Anemia, mildly symptomatic.  CURRENT MEDICATIONS:  Ibuprofen as needed for pain.  FAMILY HISTORY:  Noncontributory.  SOCIAL HISTORY:  patient is an immigrant from [place], living here in [place] with her husband, parents, brother, sister-in-law and her brother's child.  PHYSICAL EXAMINATION:  GENERAL:  Well-appearing female, appearing of her stated age of 30 and in no acute distress. VITAL SIGNS:  Blood pressure 118/80, pulse 116, respirations 20, temperature 98.9 and weight 114.7, height is 58.5 inches. HEENT:  Sclerae anicteric.  Conjunctivae pale.  Oropharynx is benign. No mucositis or candidiasis.  Notes no cervical, supraclavicular, axillary or inguinal lymphadenopathy. BREAST EXAMINATION:  Deferred. LUNGS:  Clear to auscultation bilaterally.  No frank crackles, wheezes or rhonchi. HEART:  Regular rate and rhythm with no gallops, murmurs or rubs. ABDOMEN:  Soft, mildly distended, nontender to palpation.  Positive bowel sounds x4 with no hepatomegaly, splenomegaly  or masses palpated. GENITOURINARY:  Deferred. MUSCULOSKELETAL:  No focal spinal tenderness to vigorous palpation. EXTREMITIES:  No peripheral edema. SKIN:  Benign. NEUROLOGIC:  Nonfocal.  Patient is alert and oriented x3.  REVIEW OF SYSTEMS:  As per HPI, Ms. Gorley continues to have some mild fatigue and some occasional back pain.  Otherwise, she denies any fevers, chills, night sweats, rashes or abnormal bleeding. Specifically, she has had no vaginal bleeding.  Currently, no problems with nausea or emesis.  No diarrhea, constipation or change in urinary habits.  No cough, phlegm production,  pleurisy, shortness of breath, chest pain or palpitations.  No abnormal headaches or dizziness.  Other than the mild back pain, no unusual myalgias, arthralgias or bony pain. No peripheral swelling.  LABORATORY DATA:  CBC in the outpatient clinic today shows a white count of 3.1, ANC 1.8, hemoglobin 8.5, hematocrit 25.7, MCV 80.8 and platelets of 208,000.  CMET and beta HCG are both pending.  Most recent beta HCG was down to 2478 on March 19, 2011.  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 Kirkland Hun on Jan 23, 2011 with pathology showing an 11-week molar pregnancy.  There was initial drop in beta HCG then a subsequent rise with subsequent computed tomographies of the head chest, abdomen and pelvis on February 21, 2011 confirming multiple bilateral pulmonary nodules consistent with metastatic gestational trophoblastic disease.  Now, being treated with EMA - CO chemotherapy, day 1 and 2 given as an inpatient, day 8 given in the outpatient setting.  Ms. Vandenberghe is being admitted to [facility], due for day 1 cycle III of EMA - CO to begin tomorrow, March 26, 2011.  On day 1, she will receive:  Etoposide at a dose of 100 mg per meter square intravenously over 30 minutes; actinomycin D 0.5 mg IVP and methotrexate at a dose of 100 mg per meter square IVP, then 200 mg per meter square given at 500 mL of D5W over 12 hours.  On day 2 she will receive: Etoposide 100 mg per meter square intravenously over 30 minutes; actinomycin D 0.5 mg IVP and folinic acid 15 mg IM or 15 mg p.o. q.12h. x4 doses beginning 24 hours after the start of the methotrexate.  Ms. Kneale will return to the [facility] on day 8, April 02, 2011, for outpatient chemo consisting  of cyclophosphamide given at a dose of 600 mg per meter square intravenously with vincristine 1 mg per meter square IVP.  Of course, she will also receive premeds on day 1 and 2 consisting of ondansetron and dexamethasone.  This case, admission and plan were all reviewed with Dr. Darnelle Catalan, who voices understanding in agreement with this plan.     [name], PA   ______________________________ Lowella Dell, M.D.    AGB/MEDQ  D:  03/25/2011  T:  03/25/2011  Job:  [id number]  cc:   Laurette Schimke, MD  Janine Limbo, M.D. Fax: [phone]  Electronically Signed by Zollie Scale PA on 04/04/2011 01:08:29 PM Electronically Signed by Ruthann Cancer M.D. on 04/14/2011 01:17:27 PM

## 2011-04-09 ENCOUNTER — Encounter (HOSPITAL_BASED_OUTPATIENT_CLINIC_OR_DEPARTMENT_OTHER): Payer: PRIVATE HEALTH INSURANCE | Admitting: Oncology

## 2011-04-09 ENCOUNTER — Other Ambulatory Visit: Payer: Self-pay | Admitting: Oncology

## 2011-04-09 DIAGNOSIS — O019 Hydatidiform mole, unspecified: Secondary | ICD-10-CM

## 2011-04-09 DIAGNOSIS — Z5111 Encounter for antineoplastic chemotherapy: Secondary | ICD-10-CM

## 2011-04-09 LAB — CBC WITH DIFFERENTIAL/PLATELET
BASO%: 0.6 % (ref 0.0–2.0)
Basophils Absolute: 0 10*3/uL (ref 0.0–0.1)
EOS%: 2.3 % (ref 0.0–7.0)
HCT: 29.5 % — ABNORMAL LOW (ref 34.8–46.6)
HGB: 9.5 g/dL — ABNORMAL LOW (ref 11.6–15.9)
LYMPH%: 33.9 % (ref 14.0–49.7)
MCH: 27.3 pg (ref 25.1–34.0)
MCHC: 32.2 g/dL (ref 31.5–36.0)
MONO#: 0.5 10*3/uL (ref 0.1–0.9)
NEUT%: 34.4 % — ABNORMAL LOW (ref 38.4–76.8)
Platelets: 202 10*3/uL (ref 145–400)

## 2011-04-09 LAB — COMPREHENSIVE METABOLIC PANEL
AST: 24 U/L (ref 0–37)
Albumin: 3.8 g/dL (ref 3.5–5.2)
BUN: 11 mg/dL (ref 6–23)
Calcium: 9.1 mg/dL (ref 8.4–10.5)
Chloride: 103 mEq/L (ref 96–112)
Creatinine, Ser: 0.72 mg/dL (ref 0.50–1.10)
Glucose, Bld: 126 mg/dL — ABNORMAL HIGH (ref 70–99)
Potassium: 3.5 mEq/L (ref 3.5–5.3)

## 2011-04-14 NOTE — Discharge Summary (Signed)
[name], [name]                   ACCOUNT NO.:  [id number]  MEDICAL RECORD NO.:  [id number]  LOCATION:  1342                         FACILITY:  [facility]  PHYSICIAN:  Lowella Dell, M.D.DATE OF BIRTH:  Apr 10, 1981  DATE OF ADMISSION:  03/12/2011 DATE OF DISCHARGE:  03/13/2011                              DISCHARGE SUMMARY   CONDITION ON DISCHARGE:  Stable.  DISCHARGE DIAGNOSES: 1. History of gestational trophoblastic neoplasia, high risk, admitted     for second cycle of EMA-CO. 2. History of anemia. 3. History of protein-calorie malnutrition. 4. History of remote cleft palate repair.  LABORATORY DATA ON DISCHARGE:  White count 1.6, hemoglobin 9.1, hematocrit 27.5, MCV 82.1, platelet count 242,000, ANC 1.1, lymphocytes 0.4, monocytes 0.  UA is showing slight trace of blood, and small leukocytes.  DISCHARGE MEDICATIONS: 1. Advil 1-2 tablets p.o. q.8 hours p.r.n. 2. EMLA apply 1 hour before going into port, daily as needed. 3. Leucovorin calcium 10 mg tablets 1-1/2 tablets 3 times a day for 2     days are directed. 4. Reglan 5 mg tablets p.o. before meals at bedtime p.r.n.  PROCEDURES:  Chemotherapy on March 12, 2011 initiation as follows: Etoposide 100 mg per meter square equals 140 mg IV on days 1 and 2; dactinomycin 0.5 mg IV on days 1 and 2; methotrexate 100 mg per meter square equals 140 mg IVP on day 1 only; methotrexate 200 mg per meter square equal 290 mg IV by CI over 12 hours, starting on day 1; folate 15 mg p.o. b.i.d. starting 24 hours after starting methotrexate.  The patient is to receive leucovorin dose at 6 p.m. 50 mg p.o. q.12 hours.  The patient premedications include: 1. Zofran 60 mg on days 1 and 2. 2. Decadron 20 mg on days 1 and 2.  HISTORY OF PRESENT ILLNESS:  [name] is a 30 year old woman from [place], currently resident in [place], who initially had been referred to Dr. Darnelle Catalan by Dr. Stefano Gaul with a diagnosis of molar pregnancy.  The  patient underwent a first trimester dilatation and evacuation on Jan 23, 2011 with pathology, case number is [id number], showing an 11-week molar pregnancy.  Preoperative beta HCG was 261077, and most recently down to 199560 on March 05, 2011.  The patient was evaluated on Feb 18, 2011, and initiated to methotrexate and leucovorin. Staging scans however on February 21, 2011, demonstrated evidence of multiple bilateral pulmonary consistent with metastatic gestation trophoblastic disease.  [name] is currently receiving EMA-CO given on inpatient basis.  [name] was admitted for the second cycle of chemotherapy on March 12, 2011.  Overall, [name] had responded extremely well to the first cycle of chemotherapy with the exception of mild fatigue and mild back pain, which was intermittent.  No other symptoms were experienced by her.  After evaluation by Dr. Darnelle Catalan, the patient began her EMA-CO chemotherapy protocol, day 1 being March 12, 2011 and tolerated it well, with no nausea or vomiting, or any other side effects from chemotherapy. [name] verbalized no complaints.  Her vital signs were stable, afebrile, with saturation of oxygen over 98% on room air.  On exam, [name] had no thrush.  Her port is intact.  Her lungs are clear.  Her heart showed regular rate and rhythm.  Her abdomen is benign and her extremities show no edema.  Her beta HCG is pending at the time of dictation.  [name] is receiving her second day of cycle 2 of chemotherapy today, and is expected that [name] is to be discharged in stable condition after the therapy is completed.  We are aware of her cytopenia, and this is to be followed as an outpatient.  [name] has an appointment with Dr. Darnelle Catalan on June 27 at 8:45 in the morning.  [name] knows to call the Cancer Center if [name] has any temperature of 100 or greater, or any other questions or concerns arise.  [name] is to take methotrexate 50 mg on June 21 at 6 p.m. and at midnight.  On June 22, [name] will take  methotrexate at 6 in the morning, noon, and 10 p.m.  On March 15, 2011, [name] is to receive methotrexate at 6 in the morning.     Jillian Kays, PA-C   ______________________________ Lowella Dell, M.D.    SW/MEDQ  D:  03/13/2011  T:  03/13/2011  Job:  [id number]  Electronically Signed by [name] P.A. on 03/14/2011 10:22:24 AM Electronically Signed by Ruthann Cancer M.D. on 04/14/2011 01:17:18 PM

## 2011-04-14 NOTE — Discharge Summary (Signed)
NAMEZAFIRAH, [name]                   ACCOUNT NO.:  [id number]  MEDICAL RECORD NO.:  [id number]  LOCATION:  1321                         FACILITY:  [facility]  PHYSICIAN:  Lowella Dell, M.D.DATE OF BIRTH:  1981-06-23  DATE OF ADMISSION:  03/25/2011 DATE OF DISCHARGE:  03/27/2011                              DISCHARGE SUMMARY   DISCHARGE DIAGNOSES: 1. High risk gestational trophoblastic neoplasia, with extensive lung     involvement. 2. Hypokalemia requiring intervention. 3. Anemia secondary to chemotherapy. 4. Leukopenia secondary to chemotherapy. 5. Remote history of cleft palate repair. 6. History of protein malnutrition.  PROCEDURES:  Intravenous chemotherapy, intravenous potassium replacement, intravenous alkalization of the urine.  HOSPITAL COURSE:  [name] was admitted on March 25, 2011, for her third cycle of EMA-CO chemotherapy for her molar pregnancy.  She was found to be  hypokalemic with a potassium of 3.0 and this was corrected with intravenous as well as oral potassium.  The patient also was leukopenic with a white cell count of 3.1.  The neutrophil count (absolute) was 1.88.  The patient was also anemic.  Her hemoglobin had dropped from 9.7 in late June to 8.5 at the time of admission, before hydration.  Despite the low counts and the hypokalemia, it was felt safe to proceed to treatment and indeed the patient has had a marked response in terms of her beta HCG which has dropped from greater than 350,000 to less and 3000 after the first 2 cycles of chemotherapy.  Accordingly on March 26, 2011, the patient received ondansetron 16 mg and dexamethasone 20 mg followed by etoposide at 100 mg/sq m for a total dose of 140 mg and dactinomycin 0.5 mg intravenously.  All those drugs were repeated on day #2.  On day #1, the patient also had methotrexate at 100 mg/sq m for total of 140 mg followed by 298 mg total given by continuous infusion over 12 hours.  The patient's urine  was alkalinized and at the time of this dictation her urine pH is 8.5.  The patient is scheduled to receive leucovorin 24 hours after the start of methotrexate which will be at noon today and she will continue that for the next 2 days as per the discharge medication instructions.  The patient's potassium as stated was replaced and at the time of this dictation it is up to 3.3.  Creatinine is 0.56 and the CBC at the time of discharge shows a white cell count of 2.5, hemoglobin 7.6, platelets 226,000 and absolute neutrophil count of 1.8.  I have discussed the potassium issue with the patient and she will be discharged on oral potassium supplementation.  More importantly I have discussed the anemia issue with her.  She understands the physiology of low red cells and the symptoms associated with anemia, particularly fatigue, shortness of breath, palpitations and dizziness.  The only symptom she is having at present is fatigue, this is moderate to mild and we are not proceeding to transfusion at this point.  At the time of discharge, the patient's temperature is 98.2, pulse 90, respiratory rate 18 and blood pressure 98/58.  Her room air saturation is 99%.  There are no oral lesions.  The lungs show no rales or rhonchi with good excursion bilaterally.  Heart is regular rate and rhythm. Abdomen slightly distended but benign with no organomegaly or masses palpated.  Positive bowel sounds.  There is no peripheral edema.  The laboratory values are as already discussed.  CONDITION ON DISCHARGE:  Stable.  DISCHARGE MEDICATIONS: 1. Potassium chloride 20 mEq daily with food. 2. Advil 200 mg 3 times a day as needed for pain. 3. EMLA cream before receiving chemotherapy as directed. 4. Leucovorin calcium 10 mg tablets 1-1/2 tablets three times a day     for the next 2 days beginning at noon on the day of discharge. 5. Metoclopramide 5 mg before meals and at bedtime for the next 2 days     and as  needed for nausea.  ACTIVITY:  Unrestricted.  DIET:  Unrestricted.  Wound care is not applicable.  SPECIAL INSTRUCTIONS:  She will call for shortness of breath, dizziness, rapid heartbeat/palpitations or any other worrisome symptoms.  Of course she will also call for temperature greater than 100 degrees.  FOLLOWUP:  She will receive day 8 of cycle 3 of her chemotherapy on April 02, 2011, and she has an appointment with [place] on that day as well.     Lowella Dell, M.D.     [name]  D:  03/27/2011  T:  03/27/2011  Job:  [id number]  Electronically Signed by Ruthann Cancer M.D. on 04/14/2011 01:17:21 PM

## 2011-04-16 ENCOUNTER — Other Ambulatory Visit: Payer: Self-pay | Admitting: Oncology

## 2011-04-16 ENCOUNTER — Inpatient Hospital Stay (HOSPITAL_COMMUNITY)
Admission: AD | Admit: 2011-04-16 | Discharge: 2011-04-17 | DRG: 847 | Disposition: A | Discharge status: Home / Self Care | Payer: PRIVATE HEALTH INSURANCE | Source: Ambulatory Visit | Attending: Oncology | Admitting: Oncology

## 2011-04-16 ENCOUNTER — Encounter (HOSPITAL_BASED_OUTPATIENT_CLINIC_OR_DEPARTMENT_OTHER): Payer: PRIVATE HEALTH INSURANCE | Admitting: Oncology

## 2011-04-16 DIAGNOSIS — Z5111 Encounter for antineoplastic chemotherapy: Principal | ICD-10-CM

## 2011-04-16 DIAGNOSIS — O0289 Other abnormal products of conception: Secondary | ICD-10-CM

## 2011-04-16 DIAGNOSIS — E876 Hypokalemia: Secondary | ICD-10-CM | POA: Diagnosis present

## 2011-04-16 DIAGNOSIS — D6481 Anemia due to antineoplastic chemotherapy: Secondary | ICD-10-CM | POA: Diagnosis present

## 2011-04-16 DIAGNOSIS — C7800 Secondary malignant neoplasm of unspecified lung: Secondary | ICD-10-CM | POA: Diagnosis present

## 2011-04-16 DIAGNOSIS — C58 Malignant neoplasm of placenta: Secondary | ICD-10-CM | POA: Diagnosis present

## 2011-04-16 DIAGNOSIS — T451X5A Adverse effect of antineoplastic and immunosuppressive drugs, initial encounter: Secondary | ICD-10-CM | POA: Diagnosis present

## 2011-04-16 DIAGNOSIS — O019 Hydatidiform mole, unspecified: Secondary | ICD-10-CM

## 2011-04-16 LAB — URINALYSIS, DIPSTICK ONLY
Glucose, UA: 100 mg/dL — AB
Leukocytes, UA: NEGATIVE
Leukocytes, UA: NEGATIVE
Nitrite: NEGATIVE
Nitrite: NEGATIVE
Protein, ur: NEGATIVE mg/dL
Specific Gravity, Urine: 1.016 (ref 1.005–1.030)
Urobilinogen, UA: 0.2 mg/dL (ref 0.0–1.0)
pH: 7 (ref 5.0–8.0)
pH: 8 (ref 5.0–8.0)

## 2011-04-16 LAB — CBC WITH DIFFERENTIAL/PLATELET
BASO%: 0.3 % (ref 0.0–2.0)
Basophils Absolute: 0 10e3/uL (ref 0.0–0.1)
EOS%: 1 % (ref 0.0–7.0)
Eosinophils Absolute: 0.1 10e3/uL (ref 0.0–0.5)
HCT: 32.7 % — ABNORMAL LOW (ref 34.8–46.6)
HGB: 10.2 g/dL — ABNORMAL LOW (ref 11.6–15.9)
LYMPH%: 15.9 % (ref 14.0–49.7)
MCH: 26.5 pg (ref 25.1–34.0)
MCHC: 31.2 g/dL — ABNORMAL LOW (ref 31.5–36.0)
MCV: 84.9 fL (ref 79.5–101.0)
MONO#: 0.8 10e3/uL (ref 0.1–0.9)
MONO%: 12.2 % (ref 0.0–14.0)
NEUT#: 4.8 10e3/uL (ref 1.5–6.5)
NEUT%: 70.6 % (ref 38.4–76.8)
Platelets: 280 10e3/uL (ref 145–400)
RBC: 3.85 10e6/uL (ref 3.70–5.45)
RDW: 16.6 % — ABNORMAL HIGH (ref 11.2–14.5)
WBC: 6.8 10e3/uL (ref 3.9–10.3)
lymph#: 1.1 10e3/uL (ref 0.9–3.3)

## 2011-04-16 LAB — COMPREHENSIVE METABOLIC PANEL
Albumin: 3.7 g/dL (ref 3.5–5.2)
CO2: 25 mEq/L (ref 19–32)
Calcium: 9.4 mg/dL (ref 8.4–10.5)
Chloride: 104 mEq/L (ref 96–112)
Glucose, Bld: 101 mg/dL — ABNORMAL HIGH (ref 70–99)
Potassium: 3.7 mEq/L (ref 3.5–5.3)
Sodium: 137 mEq/L (ref 135–145)
Total Protein: 7.1 g/dL (ref 6.0–8.3)

## 2011-04-17 ENCOUNTER — Inpatient Hospital Stay (HOSPITAL_COMMUNITY): Payer: PRIVATE HEALTH INSURANCE

## 2011-04-17 LAB — URINALYSIS, ROUTINE W REFLEX MICROSCOPIC
Bilirubin Urine: NEGATIVE
Leukocytes, UA: NEGATIVE
Nitrite: NEGATIVE
Specific Gravity, Urine: 1.006 (ref 1.005–1.030)
Urobilinogen, UA: 0.2 mg/dL (ref 0.0–1.0)
pH: 8 (ref 5.0–8.0)

## 2011-04-17 LAB — COMPREHENSIVE METABOLIC PANEL
AST: 47 U/L — ABNORMAL HIGH (ref 0–37)
Albumin: 3.2 g/dL — ABNORMAL LOW (ref 3.5–5.2)
Calcium: 8.6 mg/dL (ref 8.4–10.5)
Creatinine, Ser: 0.52 mg/dL (ref 0.50–1.10)
GFR calc non Af Amer: >60 mL/min (ref >60)

## 2011-04-17 LAB — CBC
MCV: 84.7 fL (ref 78.0–100.0)
Platelets: 270 10*3/uL (ref 150–400)
RBC: 3.6 MIL/uL — ABNORMAL LOW (ref 3.87–5.11)
RDW: 15.9 % — ABNORMAL HIGH (ref 11.5–15.5)
WBC: 9.5 10*3/uL (ref 4.0–10.5)

## 2011-04-17 LAB — DIFFERENTIAL
Basophils Absolute: 0 10*3/uL (ref 0.0–0.1)
Eosinophils Absolute: 0 10*3/uL (ref 0.0–0.7)
Lymphocytes Relative: 5 % — ABNORMAL LOW (ref 12–46)
Lymphs Abs: 0.5 10*3/uL — ABNORMAL LOW (ref 0.7–4.0)
Neutrophils Relative %: 93 % — ABNORMAL HIGH (ref 43–77)

## 2011-04-19 LAB — BETA HCG QUANT (REF LAB): Beta hCG, Tumor Marker: 20.5 m[IU]/mL — ABNORMAL HIGH (ref <5.0)

## 2011-04-23 ENCOUNTER — Other Ambulatory Visit: Payer: Self-pay | Admitting: Oncology

## 2011-04-23 ENCOUNTER — Encounter (HOSPITAL_BASED_OUTPATIENT_CLINIC_OR_DEPARTMENT_OTHER): Payer: PRIVATE HEALTH INSURANCE | Admitting: Oncology

## 2011-04-23 DIAGNOSIS — O019 Hydatidiform mole, unspecified: Secondary | ICD-10-CM

## 2011-04-23 DIAGNOSIS — Z5111 Encounter for antineoplastic chemotherapy: Secondary | ICD-10-CM

## 2011-04-23 LAB — CBC WITH DIFFERENTIAL/PLATELET
Eosinophils Absolute: 0.1 10*3/uL (ref 0.0–0.5)
HCT: 31.1 % — ABNORMAL LOW (ref 34.8–46.6)
LYMPH%: 15.1 % (ref 14.0–49.7)
MONO#: 0.1 10*3/uL (ref 0.1–0.9)
NEUT#: 4.2 10*3/uL (ref 1.5–6.5)
Platelets: 240 10*3/uL (ref 145–400)
RBC: 3.72 10*6/uL (ref 3.70–5.45)
WBC: 5.1 10*3/uL (ref 3.9–10.3)
lymph#: 0.8 10*3/uL — ABNORMAL LOW (ref 0.9–3.3)

## 2011-04-23 LAB — COMPREHENSIVE METABOLIC PANEL
ALT: 29 U/L (ref 0–35)
Albumin: 3.9 g/dL (ref 3.5–5.2)
CO2: 26 mEq/L (ref 19–32)
Calcium: 10 mg/dL (ref 8.4–10.5)
Chloride: 99 mEq/L (ref 96–112)
Glucose, Bld: 129 mg/dL — ABNORMAL HIGH (ref 70–99)
Sodium: 136 mEq/L (ref 135–145)
Total Bilirubin: 0.4 mg/dL (ref 0.3–1.2)
Total Protein: 7.6 g/dL (ref 6.0–8.3)

## 2011-04-26 LAB — BETA HCG QUANT (REF LAB): Beta hCG, Tumor Marker: 12 m[IU]/mL — ABNORMAL HIGH (ref <5.0)

## 2011-04-30 ENCOUNTER — Other Ambulatory Visit: Payer: Self-pay | Admitting: Oncology

## 2011-04-30 ENCOUNTER — Inpatient Hospital Stay (HOSPITAL_COMMUNITY): Admission: AD | Admit: 2011-04-30 | Payer: Self-pay | Source: Ambulatory Visit | Admitting: Oncology

## 2011-04-30 ENCOUNTER — Encounter (HOSPITAL_BASED_OUTPATIENT_CLINIC_OR_DEPARTMENT_OTHER): Payer: PRIVATE HEALTH INSURANCE | Admitting: Oncology

## 2011-04-30 DIAGNOSIS — Z5111 Encounter for antineoplastic chemotherapy: Secondary | ICD-10-CM

## 2011-04-30 DIAGNOSIS — O019 Hydatidiform mole, unspecified: Secondary | ICD-10-CM

## 2011-04-30 LAB — CBC WITH DIFFERENTIAL/PLATELET
BASO%: 1.1 % (ref 0.0–2.0)
EOS%: 4.3 % (ref 0.0–7.0)
HCT: 26.4 % — ABNORMAL LOW (ref 34.8–46.6)
MCH: 26.8 pg (ref 25.1–34.0)
MCHC: 32.2 g/dL (ref 31.5–36.0)
MONO#: 0.3 10*3/uL (ref 0.1–0.9)
RBC: 3.17 10*6/uL — ABNORMAL LOW (ref 3.70–5.45)
RDW: 14.8 % — ABNORMAL HIGH (ref 11.2–14.5)
WBC: 1.9 10*3/uL — ABNORMAL LOW (ref 3.9–10.3)
lymph#: 0.5 10*3/uL — ABNORMAL LOW (ref 0.9–3.3)
nRBC: 8 % — ABNORMAL HIGH (ref 0–0)

## 2011-04-30 LAB — COMPREHENSIVE METABOLIC PANEL
ALT: 97 U/L — ABNORMAL HIGH (ref 0–35)
AST: 41 U/L — ABNORMAL HIGH (ref 0–37)
Albumin: 3.6 g/dL (ref 3.5–5.2)
Alkaline Phosphatase: 64 U/L (ref 39–117)
BUN: 8 mg/dL (ref 6–23)
Calcium: 9.1 mg/dL (ref 8.4–10.5)
Chloride: 103 mEq/L (ref 96–112)
Potassium: 3.1 mEq/L — ABNORMAL LOW (ref 3.5–5.3)

## 2011-05-02 NOTE — H&P (Signed)
NAMEZITLALY, MALSON                   ACCOUNT NO.:  [id number]  MEDICAL RECORD NO.:  [id number]  LOCATION:  1S                           FACILITY:  [facility]  PHYSICIAN:  Lowella Dell, M.D.DATE OF BIRTH:  09/20/1981  DATE OF ADMISSION:  03/03/2011 DATE OF DISCHARGE:                             HISTORY & PHYSICAL   HISTORY OF PRESENT ILLNESS:  [name] is a 30 year old woman, originally from [place] and currently residing in [place], with a diagnosis of high-risk gestational trophoblastic neoplasia with extensive involvement of the lungs.  Originally, [name] was diagnosed by Dr. Stefano Gaul with a molar pregnancy and is status post first trimester dilatation and evacuation in early May 2012.  Pathology showed 11-week molar pregnancy ([id number]-[id number]) with a preoperative beta HCG of 261,077. While this decreased 2 weeks postop to 88,955, it began increasing again and by Feb 15, 2011, beta HCG was back up to 236,711.  [name] was evaluated by Dr. Darnelle Catalan and subsequently started on methotrexate and leucovorin.  Staging scans however obtained on February 21, 2011, showed multiple bilateral pulmonary nodules which were consistent with metastatic gestational trophoblastic disease.  There was a large left hilar nodule as well as small acute pulmonary embolism within the right lower lobe but the overall clot burden appeared to be very minimal.  Based on this information, Dr. Darnelle Catalan initiated chemotherapy with EMA-CO.  Days 1 and 2 were given as an inpatient, day 8 is given as an outpatient at the [facility].  The patient is status post 3 cycles of EMLA-CO which overall she has tolerated very well.  Admission was delayed last week on April 09, 2011, due to chemo-induced afebrile neutropenia with an ANC at that time of 600.  [name] was started on Cipro prophylactically, 500 mg p.o. b.i.d. x7 days.  She has been afebrile, her counts have recovered nicely, and she has completed her antibiotic.   She is now ready to be admitted for day 1 cycle 4.  [name] continues to feel well and really has very few complaints other than some continued fatigue and some occasional lower back pain for which she takes ibuprofen appropriately.  She denies any abdominal pain or pelvic pain.  She has had no vaginal bleeding.  No nausea or emesis. She has slight constipation for which she utilizes stool softeners and otherwise review of systems is unremarkable.  ALLERGIES:  No known drug allergies.  PAST MEDICAL HISTORY: 1. High-risk gestational trophoblastic neoplasia with extensive     involvement of the lungs, as per HPI. 2. Remote cleft palate repair. 3. History of protein malnutrition. 4. Anemia secondary to chemotherapy, mildly symptomatic. 5. Hypokalemia requiring intervention 6. Chemo-induced leukopenia with afebrile neutropenia, resolved.  CURRENT MEDICATIONS: 1. Ibuprofen as needed for pain. 2. Potassium chloride 20 mEq daily with food. 3. EMLA cream topically as needed. 4. Metoclopramide 5 mg p.o. a.c. h.s. p.r.n. nausea. 5. Colace 100 mg daily p.r.n. constipation. 6. Cipro 500 mg p.o. b.i.d., completed yesterday prophylactically.  FAMILY HISTORY:  Noncontributory.  SOCIAL HISTORY:  The patient is an immigrant from [place].  Currently residing here in [place] with her husband, parents, brother, sister- in-law  and her brother's child.  REVIEW OF SYSTEMS:  As per HPI.  The patient is doing quite well and has few complaints.  She continues to have mild fatigue, likely associated with the chemo-induced anemia.  She has some occasional back pain which is a chronic issue and is treated adequately with ibuprofen.  She continues on potassium chloride daily which she tolerates well and denies any cramping.  She has had no nausea or emesis.  She denies fevers, chills, night sweats, rashes, skin changes or abnormal bleeding. Specifically no vaginal bleeding.  Mild constipation but  continues to have bowel movements on a daily basis with no evidence of blood in the stool.  No change in urinary habits.  No cough, phlegm production, pleurisy or increased shortness of breath.  No chest pain or palpitations.  No abnormal headaches, dizziness or change in vision.  No myalgias, arthralgias or bony pain other than lower back pain noted above and no peripheral swelling.  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  PHYSICAL EXAMINATION:  GENERAL:  Well-appearing female, appearing her stated age of 16, in no acute distress. VITAL SIGNS:  Blood pressure 105/73, pulse 87, respirations 18, temperature 98.5, weight 117.0 pounds and height 58.5 inches. HEENT:  Sclerae anicteric.  Conjunctivae pink.  Oropharynx clear with no mucositis or candidiasis. NODES:  No cervical, supraclavicular, axillary or inguinal lymphadenopathy. BREASTS:  Exam deferred. LUNGS:  Clear to auscultation bilaterally.  No crackles, wheezes or rhonchi. HEART:  Regular rate and rhythm.  No gallops, murmurs or rubs. ABDOMEN:  Soft, nontender to palpation with normal bowel sounds.  No palpable masses, hepatomegaly or splenomegaly noted.  GENITOURINARY EXAM:  Deferred. MUSCULOSKELETAL:  No focal spinal tenderness to palpation or percussion. EXTREMITIES:  No peripheral edema. SKIN:  Benign. NEURO:  Nonfocal.  The patient is alert and oriented x3.  LABORATORY DATA:  CBC today shows a white count of 6.8, ANC 4.8, hemoglobin 10.2 and platelets 280,000.  A CMET is pending.  Most recent beta-HCG was down to 46.8 on April 09, 2011; down from 105.8 on April 02, 2011; and 389.5 on March 25, 2011 at time of last admission.  IMPRESSION AND PLAN:  [name] is a 30 year old [place] woman originally from [place], status post first trimester dilatation and evacuation for molar pregnancy on Jan 23, 2011, under the care of  Dr. Stefano Gaul.  She is status post initial intermuscular methotrexate dosed on Feb 18, 2011, with a slight drop and a very high beta HCG.  Staging scan on February 21, 2011, demonstrated evidence of multiple bilateral pulmonary nodules consistent with metastatic gestational trophoblastic disease.  She is currently being treated with EMA-CO, the first portion of each cycle given as an inpatient, day 8 given as an outpatient.  Due to be admitted today for day 1 cycle 4 after 1 week delay secondary to afebrile chemo-induced neutropenia which has now resolved.  On day 1, the patient received:  Etoposide at 100 mg/sq m IV over 30 minutes, actinomycin D at 0.5 mg IV push, and methotrexate at 100 mg/sq m IV push followed by 200 mg/sq m for 12 hours.  Also received folic acid at 15 mg p.o. q.12 h x4 beginning 24 hours after the start of methotrexate.  On day 2, she received:  Etoposide 100 mg/sq m IV over 30 minutes and actinomycin D at 0.5 mg IV push.  Premeds include both Zofran and dexamethasone given IV on days  1 and 2.  On day 8, the patient received treatment in the outpatient setting including: Cyclophosphamide at 600 mg/sq m IV and vincristine at 1 mg/sq m IV push, although the vincristine was held on day 8 cycle 3 secondary to peripheral neuropathy.  Ms. Millis is being admitted today to the [facility] to initiate day 1 cycle 4 of EMA-CO.  She is already scheduled to return next week to the [facility] to see Dr. Darnelle Catalan on April 23, 2011, and will be due for day 8 cycle 4 given on an outpatient basis.  This case, admission and plan were all reviewed with Dr. Darnelle Catalan who voices his agreement.     Amy Allegra Grana, PA-C   ______________________________ Lowella Dell, M.D.    AGB/MEDQ  D:  04/16/2011  T:  04/16/2011  Job:  [id number]  cc:   Laurette Schimke, MD  Janine Limbo, M.D. Fax: [phone]  Electronically Signed by Zollie Scale PA on 04/16/2011  04:03:38 PM Electronically Signed by Ruthann Cancer M.D. on 05/02/2011 08:33:29 AM

## 2011-05-02 NOTE — Discharge Summary (Signed)
[name], [name]                   ACCOUNT NO.:  [id number]  MEDICAL RECORD NO.:  [id number]  LOCATION:  1311                         FACILITY:  [facility]  PHYSICIAN:  Lowella Dell, M.D.DATE OF BIRTH:  06/03/1981  DATE OF ADMISSION:  04/16/2011 DATE OF DISCHARGE:                              DISCHARGE SUMMARY   DISCHARGE DIAGNOSES: 1. 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 2. History of remote cleft palate repair. 3. History of protein malnutrition. 4. Anemia secondary to chemotherapy. 5. Hypokalemia.  PROCEDURES:  Intravenous chemotherapy.  HOSPITAL COURSE:  The patient was admitted on April 16, 2011 for the start of her fourth cycle of chemotherapy, consisting of EMLA-CO, which she generally has been tolerating well.  We have only had to delay treatment one time, on July 18 because of a febrile neutropenia.  This had resolved by the time of this admission.  Accordingly, the patient was admitted was started on alkalizing intravenous fluids and received Zofran 8 mg and Decadron 20 mg as premeds, both day 1 and day 2.  She received etoposide 100 mg/m2 for a total dose of 140 mg and dactinomycin 0.5 mg intravenously both on days 1 and 2.  On day 1, she also received methotrexate 100 mg/m2 at 150 mg dose IV push, followed by methotrexate 200 mg/m2 at a total dose of 290 mg by continuous infusion over 12 hours.  Because of delayed getting a room available, the infusion was not started until 7:00 p.m.  The patient will complete her chemotherapy at approximately 7:00 p.m. tonight and will receive her first leucovorin dose at that point and she will then be ready for discharge.  She is tolerating chemotherapy well.  Her port is working well.  We have ordered a chest x-ray this admission to evaluate response in the lungs. Her beta HCG has dropped from greater than 350,000 to less than a 100.  At the time of this dictation, her  temperature is 98.3, pulse 72, respiratory rate 18 and blood pressure 93/60.  Her room air saturation is 99%.  Oropharynx is clear.  There is no peripheral adenopathy.  Lungs show no crackles or wheezes.  Heart is regular rate and rhythm.  No murmur appreciated.  Abdomen is benign, quite diminished from the time of initial presentation, although still slightly distended.  No peripheral edema and no focal spinal tenderness.  LABORATORY DATA:  Lab work at this point shows a white cell count of 9.5, hemoglobin 9.7, platelets 270,000, potassium 3.4, sodium 138, CO2 of 26, chloride 103, glucose 165, creatinine 0.52.  Total bilirubin 0.5, alkaline phosphatase 69.  Transaminases 47 and 49, albumin 3.2, calcium 8.6.  The urine pH this morning is 8.0.  CONDITION ON DISCHARGE:  Stable.  DIET:  Regular.  ACTIVITY:  As tolerated.  SPECIAL INSTRUCTIONS:  She will take leucovorin 15 mg p.o. t.i.d. for the next 48 hours after discharge.  She has a prescription for this in the chart since she is running low on these medications.  FOLLOWUP:  She will see me on August 1 at 8:30 p.m. for the second part of cycle 4 of  her chemotherapy.     Lowella Dell, M.D.     [name]  D:  04/17/2011  T:  04/17/2011  Job:  [id number]  cc:   Janine Limbo, M.D. Fax: [phone]  Laurette Schimke, MD  Electronically Signed by Ruthann Cancer M.D. on 05/02/2011 08:33:23 AM

## 2011-05-07 ENCOUNTER — Encounter (HOSPITAL_BASED_OUTPATIENT_CLINIC_OR_DEPARTMENT_OTHER): Payer: PRIVATE HEALTH INSURANCE | Admitting: Oncology

## 2011-05-07 ENCOUNTER — Inpatient Hospital Stay (HOSPITAL_COMMUNITY)
Admission: AD | Admit: 2011-05-07 | Discharge: 2011-05-08 | DRG: 847 | Disposition: A | Discharge status: Home / Self Care | Payer: PRIVATE HEALTH INSURANCE | Source: Ambulatory Visit | Attending: Oncology | Admitting: Oncology

## 2011-05-07 ENCOUNTER — Other Ambulatory Visit: Payer: Self-pay | Admitting: Oncology

## 2011-05-07 DIAGNOSIS — O019 Hydatidiform mole, unspecified: Secondary | ICD-10-CM

## 2011-05-07 DIAGNOSIS — R112 Nausea with vomiting, unspecified: Secondary | ICD-10-CM | POA: Diagnosis present

## 2011-05-07 DIAGNOSIS — T451X5A Adverse effect of antineoplastic and immunosuppressive drugs, initial encounter: Secondary | ICD-10-CM | POA: Diagnosis present

## 2011-05-07 DIAGNOSIS — C7800 Secondary malignant neoplasm of unspecified lung: Secondary | ICD-10-CM | POA: Diagnosis present

## 2011-05-07 DIAGNOSIS — D6481 Anemia due to antineoplastic chemotherapy: Secondary | ICD-10-CM | POA: Diagnosis present

## 2011-05-07 DIAGNOSIS — C58 Malignant neoplasm of placenta: Secondary | ICD-10-CM | POA: Diagnosis present

## 2011-05-07 DIAGNOSIS — Z5111 Encounter for antineoplastic chemotherapy: Secondary | ICD-10-CM

## 2011-05-07 DIAGNOSIS — O0289 Other abnormal products of conception: Secondary | ICD-10-CM

## 2011-05-07 LAB — CBC WITH DIFFERENTIAL/PLATELET
Basophils Absolute: 0 10*3/uL (ref 0.0–0.1)
EOS%: 2 % (ref 0.0–7.0)
Eosinophils Absolute: 0.1 10*3/uL (ref 0.0–0.5)
HGB: 9.9 g/dL — ABNORMAL LOW (ref 11.6–15.9)
LYMPH%: 33.9 % (ref 14.0–49.7)
MCH: 26.5 pg (ref 25.1–34.0)
MCV: 85.8 fL (ref 79.5–101.0)
MONO%: 22.3 % — ABNORMAL HIGH (ref 0.0–14.0)
NEUT#: 1.2 10*3/uL — ABNORMAL LOW (ref 1.5–6.5)
Platelets: 257 10*3/uL (ref 145–400)
RBC: 3.73 10*6/uL (ref 3.70–5.45)
RDW: 16.7 % — ABNORMAL HIGH (ref 11.2–14.5)

## 2011-05-08 LAB — DIFFERENTIAL
Basophils Relative: 0 % (ref 0–1)
Eosinophils Absolute: 0 10*3/uL (ref 0.0–0.7)
Eosinophils Relative: 0 % (ref 0–5)
Lymphs Abs: 0.4 10*3/uL — ABNORMAL LOW (ref 0.7–4.0)
Monocytes Relative: 5 % (ref 3–12)

## 2011-05-08 LAB — CBC
MCH: 26.8 pg (ref 26.0–34.0)
MCHC: 32.1 g/dL (ref 30.0–36.0)
MCV: 83.6 fL (ref 78.0–100.0)
Platelets: 325 10*3/uL (ref 150–400)
RDW: 16.1 % — ABNORMAL HIGH (ref 11.5–15.5)

## 2011-05-14 ENCOUNTER — Other Ambulatory Visit: Payer: Self-pay | Admitting: Oncology

## 2011-05-14 ENCOUNTER — Encounter (HOSPITAL_BASED_OUTPATIENT_CLINIC_OR_DEPARTMENT_OTHER): Payer: PRIVATE HEALTH INSURANCE | Admitting: Oncology

## 2011-05-14 DIAGNOSIS — Z5111 Encounter for antineoplastic chemotherapy: Secondary | ICD-10-CM

## 2011-05-14 DIAGNOSIS — O019 Hydatidiform mole, unspecified: Secondary | ICD-10-CM

## 2011-05-14 LAB — CBC WITH DIFFERENTIAL/PLATELET
BASO%: 0.6 % (ref 0.0–2.0)
EOS%: 1.3 % (ref 0.0–7.0)
MCH: 25.8 pg (ref 25.1–34.0)
MCHC: 32 g/dL (ref 31.5–36.0)
NEUT%: 77.7 % — ABNORMAL HIGH (ref 38.4–76.8)
RBC: 3.95 10*6/uL (ref 3.70–5.45)
RDW: 14.9 % — ABNORMAL HIGH (ref 11.2–14.5)
WBC: 3.1 10*3/uL — ABNORMAL LOW (ref 3.9–10.3)
lymph#: 0.6 10*3/uL — ABNORMAL LOW (ref 0.9–3.3)

## 2011-05-17 LAB — COMPREHENSIVE METABOLIC PANEL
ALT: 25 U/L (ref 0–35)
AST: 22 U/L (ref 0–37)
Calcium: 9.8 mg/dL (ref 8.4–10.5)
Chloride: 101 mEq/L (ref 96–112)
Creatinine, Ser: 0.98 mg/dL (ref 0.50–1.10)
Potassium: 3.3 mEq/L — ABNORMAL LOW (ref 3.5–5.3)
Sodium: 140 mEq/L (ref 135–145)
Total Protein: 6.9 g/dL (ref 6.0–8.3)

## 2011-05-21 ENCOUNTER — Other Ambulatory Visit: Payer: Self-pay | Admitting: Oncology

## 2011-05-21 ENCOUNTER — Encounter (HOSPITAL_BASED_OUTPATIENT_CLINIC_OR_DEPARTMENT_OTHER): Payer: PRIVATE HEALTH INSURANCE | Admitting: Oncology

## 2011-05-21 DIAGNOSIS — O019 Hydatidiform mole, unspecified: Secondary | ICD-10-CM

## 2011-05-21 DIAGNOSIS — Z5111 Encounter for antineoplastic chemotherapy: Secondary | ICD-10-CM

## 2011-05-21 LAB — CBC WITH DIFFERENTIAL/PLATELET
Basophils Absolute: 0 10*3/uL (ref 0.0–0.1)
EOS%: 5.8 % (ref 0.0–7.0)
HCT: 25.1 % — ABNORMAL LOW (ref 34.8–46.6)
HGB: 8.2 g/dL — ABNORMAL LOW (ref 11.6–15.9)
MCH: 25.7 pg (ref 25.1–34.0)
MCHC: 32.7 g/dL (ref 31.5–36.0)
MCV: 78.7 fL — ABNORMAL LOW (ref 79.5–101.0)
MONO%: 17.4 % — ABNORMAL HIGH (ref 0.0–14.0)
NEUT%: 26.1 % — ABNORMAL LOW (ref 38.4–76.8)
RDW: 14.3 % (ref 11.2–14.5)

## 2011-05-21 LAB — COMPREHENSIVE METABOLIC PANEL
Alkaline Phosphatase: 62 U/L (ref 39–117)
BUN: 8 mg/dL (ref 6–23)
Glucose, Bld: 133 mg/dL — ABNORMAL HIGH (ref 70–99)
Total Bilirubin: 0.3 mg/dL (ref 0.3–1.2)

## 2011-05-27 ENCOUNTER — Other Ambulatory Visit: Payer: Self-pay | Admitting: Oncology

## 2011-05-27 ENCOUNTER — Encounter (HOSPITAL_BASED_OUTPATIENT_CLINIC_OR_DEPARTMENT_OTHER): Payer: PRIVATE HEALTH INSURANCE | Admitting: Oncology

## 2011-05-27 DIAGNOSIS — O019 Hydatidiform mole, unspecified: Secondary | ICD-10-CM

## 2011-05-27 DIAGNOSIS — Z5111 Encounter for antineoplastic chemotherapy: Secondary | ICD-10-CM

## 2011-05-27 LAB — CBC WITH DIFFERENTIAL/PLATELET
Eosinophils Absolute: 0 10*3/uL (ref 0.0–0.5)
HCT: 27.4 % — ABNORMAL LOW (ref 34.8–46.6)
HGB: 8.7 g/dL — ABNORMAL LOW (ref 11.6–15.9)
LYMPH%: 22.1 % (ref 14.0–49.7)
MONO#: 1 10*3/uL — ABNORMAL HIGH (ref 0.1–0.9)
NEUT#: 0.7 10*3/uL — ABNORMAL LOW (ref 1.5–6.5)
NEUT%: 31 % — ABNORMAL LOW (ref 38.4–76.8)
Platelets: 338 10*3/uL (ref 145–400)
WBC: 2.1 10*3/uL — ABNORMAL LOW (ref 3.9–10.3)
nRBC: 0 % (ref 0–0)

## 2011-05-27 LAB — COMPREHENSIVE METABOLIC PANEL
ALT: 29 U/L (ref 0–35)
BUN: 8 mg/dL (ref 6–23)
CO2: 26 mEq/L (ref 19–32)
Calcium: 8.8 mg/dL (ref 8.4–10.5)
Chloride: 103 mEq/L (ref 96–112)
Creatinine, Ser: 0.73 mg/dL (ref 0.50–1.10)

## 2011-05-29 LAB — BETA HCG QUANT (REF LAB): Beta hCG, Tumor Marker: 1.9 m[IU]/mL (ref <5.0)

## 2011-06-04 ENCOUNTER — Encounter (HOSPITAL_BASED_OUTPATIENT_CLINIC_OR_DEPARTMENT_OTHER): Payer: PRIVATE HEALTH INSURANCE | Admitting: Oncology

## 2011-06-04 ENCOUNTER — Other Ambulatory Visit: Payer: Self-pay | Admitting: Oncology

## 2011-06-04 ENCOUNTER — Inpatient Hospital Stay (HOSPITAL_COMMUNITY): Admission: AD | Admit: 2011-06-04 | Payer: PRIVATE HEALTH INSURANCE | Source: Ambulatory Visit | Admitting: Oncology

## 2011-06-04 DIAGNOSIS — Z5111 Encounter for antineoplastic chemotherapy: Secondary | ICD-10-CM

## 2011-06-04 DIAGNOSIS — O019 Hydatidiform mole, unspecified: Secondary | ICD-10-CM

## 2011-06-04 LAB — CBC WITH DIFFERENTIAL/PLATELET
BASO%: 0.3 % (ref 0.0–2.0)
Basophils Absolute: 0 10*3/uL (ref 0.0–0.1)
EOS%: 0.4 % (ref 0.0–7.0)
HGB: 10.2 g/dL — ABNORMAL LOW (ref 11.6–15.9)
MCH: 27 pg (ref 25.1–34.0)
MCHC: 33.2 g/dL (ref 31.5–36.0)
RBC: 3.76 10*6/uL (ref 3.70–5.45)
RDW: 18.1 % — ABNORMAL HIGH (ref 11.2–14.5)
lymph#: 0.9 10*3/uL (ref 0.9–3.3)

## 2011-06-04 LAB — COMPREHENSIVE METABOLIC PANEL
AST: 23 U/L (ref 0–37)
Alkaline Phosphatase: 65 U/L (ref 39–117)
BUN: 12 mg/dL (ref 6–23)
Calcium: 9.4 mg/dL (ref 8.4–10.5)
Chloride: 101 mEq/L (ref 96–112)
Creatinine, Ser: 0.91 mg/dL (ref 0.50–1.10)
Total Bilirubin: 0.3 mg/dL (ref 0.3–1.2)

## 2011-06-11 ENCOUNTER — Inpatient Hospital Stay (HOSPITAL_COMMUNITY): Payer: PRIVATE HEALTH INSURANCE

## 2011-06-11 ENCOUNTER — Other Ambulatory Visit: Payer: Self-pay | Admitting: Oncology

## 2011-06-11 ENCOUNTER — Encounter (HOSPITAL_BASED_OUTPATIENT_CLINIC_OR_DEPARTMENT_OTHER): Payer: PRIVATE HEALTH INSURANCE | Admitting: Oncology

## 2011-06-11 ENCOUNTER — Inpatient Hospital Stay (HOSPITAL_COMMUNITY)
Admission: AD | Admit: 2011-06-11 | Discharge: 2011-06-12 | DRG: 847 | Disposition: A | Discharge status: Home / Self Care | Payer: PRIVATE HEALTH INSURANCE | Source: Ambulatory Visit | Attending: Oncology | Admitting: Oncology

## 2011-06-11 DIAGNOSIS — T451X5A Adverse effect of antineoplastic and immunosuppressive drugs, initial encounter: Secondary | ICD-10-CM | POA: Diagnosis present

## 2011-06-11 DIAGNOSIS — E876 Hypokalemia: Secondary | ICD-10-CM | POA: Diagnosis present

## 2011-06-11 DIAGNOSIS — Z5111 Encounter for antineoplastic chemotherapy: Secondary | ICD-10-CM

## 2011-06-11 DIAGNOSIS — R112 Nausea with vomiting, unspecified: Secondary | ICD-10-CM | POA: Diagnosis present

## 2011-06-11 DIAGNOSIS — D392 Neoplasm of uncertain behavior of placenta: Secondary | ICD-10-CM

## 2011-06-11 DIAGNOSIS — C7800 Secondary malignant neoplasm of unspecified lung: Secondary | ICD-10-CM | POA: Diagnosis present

## 2011-06-11 DIAGNOSIS — C58 Malignant neoplasm of placenta: Secondary | ICD-10-CM | POA: Diagnosis present

## 2011-06-11 DIAGNOSIS — D6481 Anemia due to antineoplastic chemotherapy: Secondary | ICD-10-CM | POA: Diagnosis present

## 2011-06-11 DIAGNOSIS — O019 Hydatidiform mole, unspecified: Secondary | ICD-10-CM

## 2011-06-11 LAB — CBC WITH DIFFERENTIAL/PLATELET
Basophils Absolute: 0 10*3/uL (ref 0.0–0.1)
Eosinophils Absolute: 0 10*3/uL (ref 0.0–0.5)
HCT: 33.2 % — ABNORMAL LOW (ref 34.8–46.6)
HGB: 10.6 g/dL — ABNORMAL LOW (ref 11.6–15.9)
MCV: 80.8 fL (ref 79.5–101.0)
MONO%: 6.8 % (ref 0.0–14.0)
NEUT#: 5.4 10*3/uL (ref 1.5–6.5)
RDW: 16.7 % — ABNORMAL HIGH (ref 11.2–14.5)

## 2011-06-12 LAB — DIFFERENTIAL
Basophils Relative: 0 % (ref 0–1)
Eosinophils Relative: 0 % (ref 0–5)
Lymphocytes Relative: 5 % — ABNORMAL LOW (ref 12–46)
Monocytes Absolute: 0.1 10*3/uL (ref 0.1–1.0)
Neutrophils Relative %: 94 % — ABNORMAL HIGH (ref 43–77)

## 2011-06-12 LAB — CBC
HCT: 29.9 % — ABNORMAL LOW (ref 36.0–46.0)
Hemoglobin: 10.1 g/dL — ABNORMAL LOW (ref 12.0–15.0)
WBC: 6 10*3/uL (ref 4.0–10.5)

## 2011-06-18 ENCOUNTER — Other Ambulatory Visit: Payer: Self-pay | Admitting: Oncology

## 2011-06-18 ENCOUNTER — Encounter (HOSPITAL_BASED_OUTPATIENT_CLINIC_OR_DEPARTMENT_OTHER): Payer: PRIVATE HEALTH INSURANCE | Admitting: Oncology

## 2011-06-18 DIAGNOSIS — O019 Hydatidiform mole, unspecified: Secondary | ICD-10-CM

## 2011-06-18 DIAGNOSIS — D6481 Anemia due to antineoplastic chemotherapy: Secondary | ICD-10-CM

## 2011-06-18 DIAGNOSIS — E876 Hypokalemia: Secondary | ICD-10-CM

## 2011-06-18 DIAGNOSIS — T451X5A Adverse effect of antineoplastic and immunosuppressive drugs, initial encounter: Secondary | ICD-10-CM

## 2011-06-18 DIAGNOSIS — Z5111 Encounter for antineoplastic chemotherapy: Secondary | ICD-10-CM

## 2011-06-18 LAB — CBC WITH DIFFERENTIAL/PLATELET
Eosinophils Absolute: 0.1 10*3/uL (ref 0.0–0.5)
HCT: 30.4 % — ABNORMAL LOW (ref 34.8–46.6)
HGB: 9.9 g/dL — ABNORMAL LOW (ref 11.6–15.9)
LYMPH%: 20.1 % (ref 14.0–49.7)
MONO#: 0 10*3/uL — ABNORMAL LOW (ref 0.1–0.9)
NEUT#: 2.6 10*3/uL (ref 1.5–6.5)
NEUT%: 75.3 % (ref 38.4–76.8)
Platelets: 130 10*3/uL — ABNORMAL LOW (ref 145–400)
WBC: 3.5 10*3/uL — ABNORMAL LOW (ref 3.9–10.3)
lymph#: 0.7 10*3/uL — ABNORMAL LOW (ref 0.9–3.3)

## 2011-06-18 LAB — COMPREHENSIVE METABOLIC PANEL
ALT: 38 U/L — ABNORMAL HIGH (ref 0–35)
Albumin: 3.7 g/dL (ref 3.5–5.2)
BUN: 12 mg/dL (ref 6–23)
CO2: 27 mEq/L (ref 19–32)
Calcium: 9.5 mg/dL (ref 8.4–10.5)
Chloride: 103 mEq/L (ref 96–112)
Creatinine, Ser: 0.6 mg/dL (ref 0.50–1.10)
Potassium: 3.4 mEq/L — ABNORMAL LOW (ref 3.5–5.3)

## 2011-06-22 LAB — BETA HCG QUANT (REF LAB): Beta hCG, Tumor Marker: 1.6 m[IU]/mL (ref <5.0)

## 2011-06-25 ENCOUNTER — Other Ambulatory Visit: Payer: Self-pay | Admitting: Oncology

## 2011-06-25 ENCOUNTER — Encounter (HOSPITAL_BASED_OUTPATIENT_CLINIC_OR_DEPARTMENT_OTHER): Payer: PRIVATE HEALTH INSURANCE | Admitting: Oncology

## 2011-06-25 DIAGNOSIS — Z5111 Encounter for antineoplastic chemotherapy: Secondary | ICD-10-CM

## 2011-06-25 DIAGNOSIS — O019 Hydatidiform mole, unspecified: Secondary | ICD-10-CM

## 2011-06-25 DIAGNOSIS — L738 Other specified follicular disorders: Secondary | ICD-10-CM

## 2011-06-25 DIAGNOSIS — D702 Other drug-induced agranulocytosis: Secondary | ICD-10-CM

## 2011-06-25 DIAGNOSIS — K1370 Unspecified lesions of oral mucosa: Secondary | ICD-10-CM

## 2011-06-25 LAB — CBC WITH DIFFERENTIAL/PLATELET
Basophils Absolute: 0 10*3/uL (ref 0.0–0.1)
EOS%: 7.6 % — ABNORMAL HIGH (ref 0.0–7.0)
HGB: 8.3 g/dL — ABNORMAL LOW (ref 11.6–15.9)
MCH: 25.5 pg (ref 25.1–34.0)
MCV: 77.9 fL — ABNORMAL LOW (ref 79.5–101.0)
MONO%: 8.6 % (ref 0.0–14.0)
NEUT#: 0.5 10*3/uL — ABNORMAL LOW (ref 1.5–6.5)
RBC: 3.26 10*6/uL — ABNORMAL LOW (ref 3.70–5.45)
RDW: 15.4 % — ABNORMAL HIGH (ref 11.2–14.5)
lymph#: 0.4 10*3/uL — ABNORMAL LOW (ref 0.9–3.3)
nRBC: 4 % — ABNORMAL HIGH (ref 0–0)

## 2011-06-25 LAB — COMPREHENSIVE METABOLIC PANEL
ALT: 158 U/L — ABNORMAL HIGH (ref 0–35)
AST: 60 U/L — ABNORMAL HIGH (ref 0–37)
Albumin: 3.8 g/dL (ref 3.5–5.2)
Alkaline Phosphatase: 62 U/L (ref 39–117)
Glucose, Bld: 141 mg/dL — ABNORMAL HIGH (ref 70–99)
Potassium: 2.9 mEq/L — ABNORMAL LOW (ref 3.5–5.3)
Sodium: 137 mEq/L (ref 135–145)
Total Bilirubin: 0.4 mg/dL (ref 0.3–1.2)
Total Protein: 6.8 g/dL (ref 6.0–8.3)

## 2011-06-27 NOTE — H&P (Signed)
[name]                   ACCOUNT NO.:  [id number]  MEDICAL RECORD NO.:  [id number]  LOCATION:  1306                         FACILITY:  [facility]  PHYSICIAN:  Lowella Dell, M.D.DATE OF BIRTH:  [birth year]  DATE OF ADMISSION:  05/07/2011 DATE OF DISCHARGE:                             HISTORY & PHYSICAL   HISTORY OF PRESENT ILLNESS:  [name] is a 30 year old woman currently residing in [place] and originally from [place].  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 Stefano Gaul and status post first trimester dilatation and evacuation in early May 2012.  Pathology showed an 11-week molar pregnancy ([id number]) with a preoperative beta HCG of 261,077.  This initially decreased postop, but began increasing again and by the end of May, was up to 236,711.  At that point, she was evaluated by Dr. Darnelle Catalan, subsequently started on methotrexate and leucovorin.  Staging scans on February 21, 2011, however, showed multiple bilateral pulmonary nodules consistent with metastatic gestational trophoblastic disease.  In addition, there was a large left hilar nodule and small acute pulmonary embolism within the right lower lobe with very minimal clot burden.  Accordingly, chemotherapy was initiated consisting of EMA-CO, with days 1 and 2 given on inpatient basis and day 8 given as an outpatient at the cancer center.  Admission was delayed last week secondary to chemo-induced afebrile neutropenia, and the patient is now ready to begin day 1 cycle 5 of EMA-CO.  Now, she is feeling well with the exceptions of mild fatigue and mild lower back pain, neither of which are new.  She denies any abdominal or pelvic pain.  She has had no nausea or emesis, and is managing mild constipation very well with stool softeners.  She has had no fevers, chills, or night sweats.  ALLERGIES:  No known drug  allergies.  PAST MEDICAL HISTORY: 1. High-risk gestational trophoblastic neoplasia with extensive     involvement of the lungs, as per HPI. 2. Remote cleft palate repair. 3. Chemo-induced leukopenia with afebrile neutropenia, resolved. 4. Anemia secondary to chemotherapy, mildly symptomatic. 5. History of hypokalemia requiring intervention. 6. History of protein malnutrition.  CURRENT MEDICATIONS: 1. Ibuprofen as needed for pain. 2. EMLA cream topically as needed for port access. 3. Potassium chloride 20 mEq daily with food. 4. Metoclopramide 5 mg p.o. a.c. h.s. p.r.n. nausea. 5. Colace 100 mg p.o. q.day p.r.n. constipation.  SOCIAL HISTORY:  The patient currently resides in [place] with her husband, parents, brother, sister-in-law, and her brother's child.  She is an immigrant from [place].  No history of tobacco, alcohol, or illicit drug abuse.  FAMILY HISTORY:  Noncontributory.  REVIEW OF SYSTEMS:  As per HPI.  CONSTITUTIONAL:  The patient continues to complain of mild fatigue and occasional lower back pain for which she takes ibuprofen effectively.  She has had no fevers, chills, night sweats, rashes, or abnormal bleeding.  GYN:  No menstrual cycle and no vaginal bleeding.  GI:  No nausea or emesis.  No diarrhea or constipation.  GU:  No change in urinary habits.  RESPIRATORY:  No cough, phlegm production, pleurisy,  or increased shortness of breath. No chest pain or palpitations.  NEURO:  No abnormal headaches, dizziness, or change in vision.  EXTREMITIES:  Currently, no myalgias, arthralgias, or bony pain.  No peripheral swelling.  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  No mouth ulcers, oral sensitivity, or problems swallowing.  PHYSICAL EXAM:  GENERAL:  Well-appearing appearing female, appearing stated age of 74 and in no acute distress. VITAL SIGNS:  Blood pressure 121/90, pulse 106, respirations 20, and temperature 98.6.  Weight  117.2 and height 58.5 inches. HEENT:  Sclerae anicteric.  Conjunctivae pale.  Oropharynx is clear with no mucositis or candidiasis.  The patient's mucosa is pink and moist. NECK:  Supple.  No cervical, supraclavicular, or axillary lymphadenopathy.  No inguinal lymphadenopathy. BREAST EXAM:  Deferred. LUNGS:  Clear to auscultation bilaterally.  No crackles, rhonchi, or wheezes. HEART:  Regular rate and rhythm. ABDOMEN:  Soft, nontender to palpation.  Positive bowel sounds.  No hepatomegaly, splenomegaly, or masses palpated. GENITOURINARY:  Deferred. MUSCULOSKELETAL:  No focal spinal tenderness to palpation. EXTREMITIES:  No peripheral edema. SKIN:  Benign with no skin changes or rashes noted.  Good skin turgor. NEURO:  Nonfocal.  The patient alert and oriented x3.  LABS:  CBC today showed a white count of 3.0, ANC 1.2, hemoglobin 9.9, hematocrit 32.0, MCV 85.8, and platelets 157,000.  A C-MET and beta HCG are both pending.  Most recent beta HCG drawn on April 30, 2011, was down to 8.4.  IMPRESSION AND PLAN:  [name] is a 30 year old [place] woman originally from [place] and currently being treated for diagnosis of 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, receiving EMA-CO since February 27, 2011, with days 1 and 2 given as an inpatient, day 8 as an outpatient.  Thus far with a good tolerance with the exception of afebrile neutropenia.  Overall, they continued decrease in beta HCG, most recently down to 8.4 from almost 400,000 in May 2012. Due to be admitted for day 1 cycle 5 of the EMA-CO after 1 week delay secondary to afebrile chemo-induced neutropenia, resolved.  On day 1, the patient receives:  Etoposide at 100 mg per meter square intravenous over 30 minutes; actinomycin D at 0.5 mg intravenous push; and methotrexate at 100 mg per meter square intravenous push followed by 200 mg per meter square for 12 hours.  Also receives folic acid at 15 mg p.o.  q.12 hours x4 beginning 24 hours after the start of methotrexate.  On day 2, the patient will receive:  Etoposide at 100 mg per meter square intravenous over 30 minutes; and actinomycin D at 0.5 mg intravenous push.  Premeds will include both ondansetron and dexamethasone, given intravenously on days 1 and 2.  This case, admission and plan were all reviewed with Dr. Darnelle Catalan.     [name], PA   ______________________________ Lowella Dell, M.D.    AGB/MEDQ  D:  05/07/2011  T:  05/08/2011  Job:  [id number]  Electronically Signed by AMY BERRY PA on 06/13/2011 01:36:26 PM Electronically Signed by Ruthann Cancer M.D. on 06/27/2011 09:54:13 AM

## 2011-06-27 NOTE — Discharge Summary (Signed)
  NAMERICCI, PAFF                   ACCOUNT NO.:  [id number]  MEDICAL RECORD NO.:  [id number]  LOCATION:  1321                         FACILITY:  [facility]  PHYSICIAN:  Lowella Dell, M.D.DATE OF BIRTH:  [birth year]  DATE OF ADMISSION:  06/11/2011 DATE OF DISCHARGE:  06/12/2011                              DISCHARGE SUMMARY   DISCHARGE DIAGNOSES: 1. High-risk gestational trophoblastic neoplasia. 2. Anemia secondary to chemotherapy. 3. Extensive involvement of the lungs secondary to tumor. 4. History of remote cleft palate repair. 5. History of hypokalemia. 6. Poorly controlled nausea and vomiting.  PROCEDURES:  Intravenous chemotherapy.  HOSPITAL COURSE:  The patient was admitted on June 11, 2011, for her first of 3 postremission cycles of EMA-CO chemotherapy.  She received Zofran 16 mg intravenously and Decadron 20 mg intravenously days #1 and #2 of chemotherapy.  On day #1, she also received a total dose of 140 mg of etoposide followed by 0.5 mg of actinomycin D followed by 140 mg of methotrexate followed by 290 mg of methotrexate given over 12 hours.  On the 2nd day, she received the etoposide and actinomycin D again after the completion of methotrexate.  [place] did have significant nausea this admission.  We increased her nausea medication to include Phenergan 25 mg with Ativan 0.5 mg intravenously 4 times a day and that helped.  We are increasing her antinausea medications at home to include metoclopramide 10 mg a.c. and h.s. and ondansetron 8 mg 3 times a day for the next 2 days and hopefully that will prevent the nausea and vomiting she has been having at home.  Otherwise, she tolerated treatment well.  Her port was easily accessed. She had unremarkable vitals and at the time of this dictation, her temperature is 98.7, pulse 88, respirations 16, blood pressure 101/59, and room air saturation is 99%.  Her lab work on the morning of discharge showed a white cell  count of 6.0, hemoglobin 10.1, MCV 78.5, and platelets 145,000.  We obtained a chest x-ray to follow her tumor spread to the lungs and this showed continuing multiple bilateral pulmonary nodules, but significantly decreased as compared to prior films.  CONDITION AT DISCHARGE:  Stable.  DISCHARGE MEDICATIONS: 1. Leucovorin 15 mg 3 times a day for 2 days. 2. Metoclopramide 10 mg before meals and at bedtime for 2 days. 3. Ondansetron 8 mg 3 times a day for 2 days. 4. Advil 200 mg as needed every 8 hours for pain. 5. EMLA cream as before. 6. Potassium chloride 20 mEq 3 times a day with meals.  DIET:  Unrestricted.  ACTIVITY:  Unrestricted.  WOUND CARE:  Not applicable.  FOLLOWUP:  She will see [place] in the office, Wednesday, September 26th, at 8:45 a.m. to receive the day #8 portion of this first cycle of chemotherapy.     Lowella Dell, M.D.     [name]  D:  06/12/2011  T:  06/12/2011  Job:  [id number]  Electronically Signed by Ruthann Cancer M.D. on 06/27/2011 09:54:03 AM

## 2011-06-27 NOTE — H&P (Signed)
[name], Jillian                   ACCOUNT NO.:  [id number]  MEDICAL RECORD NO.:  [id number]  LOCATION:  1321                         FACILITY:  [facility]  PHYSICIAN:  Lowella Dell, M.D.DATE OF BIRTH:  1981-09-14  DATE OF ADMISSION:  06/11/2011 DATE OF DISCHARGE:                             HISTORY & PHYSICAL   [name] is a 30 year old [place] woman currently residing in [place], diagnosed with high-risk gestational trophoblastic neoplasia.  She was found to have extensive involvement of the lungs. Her original diagnosis was made by Dr. Stefano Gaul and the patient underwent first trimester dilatation and evacuation early May 2012. The pathology showed an 11-week molar pregnancy with a preoperative beta-HCG of 261,077.  Postoperatively, this continued to increase almost to 400,000 and after evaluation in late May 2012,  the patient was started on methotrexate and Leucovorin.  However, when scans early June showed multiple bilateral pulmonary nodules, she was started on chemotherapy, specifically EMA-CO.  We are giving her days 1 and 2 as an inpatient, day 8 as an outpatient.  The patient's HCG tumor marker has dropped very steadily and finally was at less than 5 on August 22.  It was 2.8 on August 29 and is currently 1.7.  The plan is to give her 3 additional cycles of EMA-CO post- remission and this is day 1, cycle 1 of the three planned post-remission cycles.  She has tolerated chemotherapy well aside from problems with fatigue. She has mild pain associated with her port, particularly if she sleeps in a prone position.  She had a little bit of sinus trouble this weekend, which she treated with over-the-counter decongestants.  She has had no fever, currently no cough, phlegm production, pleurisy, or hemoptysis.  No shortness of breath.  She has been able to continue to work although a fewer hours than before.  She had a period last month.  A detailed review of systems  is otherwise entirely noncontributory.  PAST MEDICAL HISTORY:  Significant for: 1. Remote cleft palate repair. 2. History of chemotherapy-induced anemia. 3. History of hypokalemia. 4. History of chemotherapy-induced leukopenia.  FAMILY HISTORY:  Noncontributory.  CURRENT MEDICATIONS:  Include: 1. Ibuprofen as needed. 2. Potassium chloride 25 mEq three times a day with liquid, EMLA cream     as needed. 3. Metoclopramide 5 mg before meals and at bedtime as needed for     nausea. 4. Colace 100 mg daily as needed for constipation. 5. Cipro taken prophylactically at the nadir point of each cycle.  SOCIAL HISTORY:  The patient is married, lives with her husband, parents, brother, sister-in-law and brother's child.  There is no history of alcohol or tobacco abuse.  PHYSICAL EXAM:  GENERAL:  Shows a young [place] Asian woman who weighs 112 pounds. VITAL SIGNS:  Temperature is 98.8, pulse 93, respiratory rate 20, and blood pressure 114/77.  Her BSA is 1.43. HEENT:  Sclerae are nonicteric.  Oropharynx is clear. NECK:  Supple.  There is no peripheral adenopathy. LUNGS:  No crackles or wheezes. HEART:  Regular rate and rhythm.  No murmur appreciated. BREASTS:  Exam deferred. ABDOMEN:  Soft, nontender.  Positive bowel sounds. MUSCULOSKELETAL EXAM:  No focal spinal tenderness.  No peripheral edema. NEUROLOGIC EXAM:  Nonfocal.  LABS:  Lab work today shows a white cell count of 6.9, hemoglobin 10.6, and platelets 173,000.  Labs obtained, September 12 showed a creatinine of 0.91, normal liver function test, normal electrolytes except for slightly low potassium at 3.3.  IMPRESSION AND PLAN:  A 30 year old [place] woman originally from [place] status post first trimester dilation and curettage for a molar pregnancy, May 2012, this being high risk, with multiple bilateral pulmonary nodules consistent with metastatic disease, treated with EMA- CO times five, with the attainment of remission  by HCG, now on day 1, cycle 1 of 3 planned postremission cycles of the same chemotherapy.  [name] has a very good understanding of the possible toxicity, side effects and complications of this treatment and of the fact that she will need this one and to further treatments to consolidate her remission.  She will return to see [name] at 10:15 in the morning on September 26 to receive the second part of this cycle of chemotherapy. She will have a visit with [name] on that day and at that time, we will set her up for the remaining 2 cycles.  She knows to call for any problems that may develop before that visit.  I anticipate she will be ready to go home on September 20 in the evening after the completion of her chemotherapy.     Lowella Dell, M.D.     [name]  D:  06/11/2011  T:  06/11/2011  Job:  [id number]  cc:   Janine Limbo, M.D. Fax: [phone]  Laurette Schimke, MD  Electronically Signed by Ruthann Cancer M.D. on 06/27/2011 09:54:06 AM

## 2011-07-02 ENCOUNTER — Inpatient Hospital Stay (HOSPITAL_COMMUNITY)
Admission: AD | Admit: 2011-07-02 | Discharge: 2011-07-03 | DRG: 846 | Disposition: A | Discharge status: Home / Self Care | Payer: PRIVATE HEALTH INSURANCE | Source: Ambulatory Visit | Attending: Oncology | Admitting: Oncology

## 2011-07-02 ENCOUNTER — Inpatient Hospital Stay (HOSPITAL_COMMUNITY): Payer: PRIVATE HEALTH INSURANCE

## 2011-07-02 ENCOUNTER — Other Ambulatory Visit: Payer: Self-pay | Admitting: Oncology

## 2011-07-02 ENCOUNTER — Encounter (HOSPITAL_BASED_OUTPATIENT_CLINIC_OR_DEPARTMENT_OTHER): Payer: PRIVATE HEALTH INSURANCE | Admitting: Oncology

## 2011-07-02 DIAGNOSIS — L738 Other specified follicular disorders: Secondary | ICD-10-CM

## 2011-07-02 DIAGNOSIS — Z5111 Encounter for antineoplastic chemotherapy: Secondary | ICD-10-CM

## 2011-07-02 DIAGNOSIS — E876 Hypokalemia: Secondary | ICD-10-CM | POA: Diagnosis present

## 2011-07-02 DIAGNOSIS — K1370 Unspecified lesions of oral mucosa: Secondary | ICD-10-CM

## 2011-07-02 DIAGNOSIS — R110 Nausea: Secondary | ICD-10-CM | POA: Diagnosis present

## 2011-07-02 DIAGNOSIS — T451X5A Adverse effect of antineoplastic and immunosuppressive drugs, initial encounter: Secondary | ICD-10-CM | POA: Diagnosis present

## 2011-07-02 DIAGNOSIS — C58 Malignant neoplasm of placenta: Secondary | ICD-10-CM | POA: Diagnosis present

## 2011-07-02 DIAGNOSIS — D61810 Antineoplastic chemotherapy induced pancytopenia: Secondary | ICD-10-CM | POA: Diagnosis present

## 2011-07-02 DIAGNOSIS — C7800 Secondary malignant neoplasm of unspecified lung: Secondary | ICD-10-CM | POA: Diagnosis present

## 2011-07-02 LAB — COMPREHENSIVE METABOLIC PANEL
Albumin: 3.9 g/dL (ref 3.5–5.2)
Albumin: 3.5 g/dL (ref 3.5–5.2)
Alkaline Phosphatase: 77 U/L (ref 39–117)
Alkaline Phosphatase: 71 U/L (ref 39–117)
BUN: 12 mg/dL (ref 6–23)
BUN: 12 mg/dL (ref 6–23)
Calcium: 9.2 mg/dL (ref 8.4–10.5)
Chloride: 103 mEq/L (ref 96–112)
Creatinine, Ser: 0.64 mg/dL (ref 0.50–1.10)
Potassium: 3.4 mEq/L — ABNORMAL LOW (ref 3.5–5.1)
Total Protein: 7.5 g/dL (ref 6.0–8.3)
Total Protein: 6.8 g/dL (ref 6.0–8.3)

## 2011-07-02 LAB — CBC WITH DIFFERENTIAL/PLATELET
BASO%: 1.3 % (ref 0.0–2.0)
HCT: 30.2 % — ABNORMAL LOW (ref 34.8–46.6)
LYMPH%: 24.2 % (ref 14.0–49.7)
MCH: 24.9 pg — ABNORMAL LOW (ref 25.1–34.0)
MCHC: 31.1 g/dL — ABNORMAL LOW (ref 31.5–36.0)
MCV: 79.9 fL (ref 79.5–101.0)
MONO#: 0.5 10*3/uL (ref 0.1–0.9)
MONO%: 15.1 % — ABNORMAL HIGH (ref 0.0–14.0)
NEUT%: 58.4 % (ref 38.4–76.8)
Platelets: 316 10*3/uL (ref 145–400)
RBC: 3.78 10*6/uL (ref 3.70–5.45)
nRBC: 0 % (ref 0–0)

## 2011-07-02 LAB — HCG, QUANTITATIVE, PREGNANCY: hCG, Beta Chain, Quant, S: 1 m[IU]/mL (ref <5)

## 2011-07-02 NOTE — H&P (Signed)
[name], [name]                   ACCOUNT NO.:  [id number]  MEDICAL RECORD NO.:  [id number]  LOCATION:                               FACILITY:  [facility]  PHYSICIAN:  Lowella Dell, M.D.DATE OF BIRTH:  02-06-1981  DATE OF ADMISSION: DATE OF DISCHARGE:                             HISTORY & PHYSICAL   HISTORY OF PRESENT ILLNESS:  Ms. 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  She was found to have extensive involvement of the lungs.  Her original diagnosis was made by Dr. Stefano Gaul with a molar pregnancy and status post first trimester dilatation and evacuation in early May 2012.  Pathology showed an 11-week molar pregnancy ([id number]).  There was a preoperative beta-hCG of 261,077, which initially decreased, then increased again to almost 400,000.  After evaluation by Dr. Darnelle Catalan, in late May 2012, Ms. Bergthold was started on methotrexate and leucovorin.  Unfortunately, staging scans obtained February 21, 2011, showed multiple bilateral pulmonary nodules consistent with metastatic gestational trophoblastic disease.  Ms. Knabe was started on chemotherapy, specifically EMA - CO.  Days 1 and 2 were given as an inpatient, day 8 as an outpatient at the [facility].  The patient is due for day 1, cycle 6 of EMA - CO.  Admission has been delayed for the past 2 weeks due to chemotherapy induced afebrile neutropenia.  Counts have recovered nicely, the patient has completed a course of prophylactic Cipro, and is ready to proceed.  Ms. Kahre has few complaints today other than continued fatigue.  She does have some chronic lower back pain, which was a problem even prior to her diagnosis and is treated adequately with ibuprofen.  She denies any vaginal bleeding.  She has had no abdominal pain or pelvic pain.  No nausea or emesis.  No change in bowel habits.  No increased shortness  of breath.  ALLERGIES:  No known drug allergies.  PAST MEDICAL HISTORY: 1. 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 2. Remote cleft palate repair. 3. History of protein malnutrition. 4. Symptomatic chemotherapy induced anemia. 5. Hypokalemia, requiring intervention. 6. Chemotherapy induced leukopenia with afebrile neutropenia,     resolved.  CURRENT MEDICATIONS: 1. Ibuprofen as needed for pain. 2. Potassium chloride effervescent tablets 25 mEq p.o. t.i.d.     dissolved in beverage. 3. EMLA cream as needed. 4. Metoclopramide 5 mg p.o. a.c. and at bedtime p.r.n. nausea. 5. Colace 100 mg daily p.r.n. constipation. 6. Cipro 500 mg p.o. b.i.d., taken prophylactically and completed     yesterday.  FAMILY HISTORY:  Noncontributory.  SOCIAL HISTORY:  The patient is an immigrant from [place], currently residing here in [place] with her husband, her parents, her brother, sister-in-law, and her brother's child.  No history of alcohol, tobacco, or illicit drug abuse.  REVIEW OF SYSTEMS:  As per HPI, the patient's primary complaint is continued fatigue, likely associated with her chemotherapy induced anemia.  She has had no chills, night sweats, rashes, or abnormal bleeding.  No vaginal bleeding.  No recent nausea or emesis.  No change in bowel or bladder habits.  No cough, phlegm production, pleurisy, or increased shortness of breath.  No chest pain, pressure, palpitations. No abnormal headaches, dizziness, or change in vision.  She has some occasional lower back pain, which has not increased, but denies any additional myalgias, arthralgias or bony pain.  No peripheral swelling. She has occasional episodes of tingling in her fingertips, but this is mild and seems to have improved.  No numbness or tingling in the lower extremities.  No mouth ulcers or oral sensitivity noted and no problem swallowing.  PHYSICAL  EXAMINATION:  GENERAL:  Well-appearing female in no acute distress. VITAL SIGNS:  Blood pressure 124/78, pulse 89, respirations 20, temperature 98.7, height 58.5 inches and weight 113.5 pounds. HEENT: Sclerae anicteric.  Conjunctivae pink.  Oropharynx is benign.  No mucositis or candidiasis noted. NODES:  No peripheral lymphadenopathy. BREASTS:  Deferred. LUNGS:  Clear to auscultation bilaterally.  No crackles, rhonchi, or wheezes. HEART:  Regular rate and rhythm with no gallops, murmurs, or rubs. ABDOMEN:  Soft, nontender to palpation.  Positive bowel sounds.  No organomegaly or masses palpated. GENITOURINARY:  Deferred. MUSCULOSKELETAL:  No focal spinal tenderness to palpation. EXTREMITIES:  No peripheral edema or cyanosis. SKIN:  Benign. NEUROLOGIC:  Nonfocal.  The patient is alert and oriented x3.  LABORATORY DATA:  White count 6.6, ANC 5.2, hemoglobin 10.2, and platelets 276,000.  CMET showed a low potassium today of 3.3.  Remainder of CMET was all within normal limits including a nonfasting glucose of 86, BUN 12, creatinine 0.91, alkaline phosphatase 65, AST 23, ALT 23, and calcium of 9.4.  Beta-hCG is pending today, most recently drawn on May 27, 2011 and within normal limits at 1.9.  Beta-hCG has been normal since May 14, 2011, at which time it was down to 4.0.  IMPRESSION AND PLAN:  Dereonna Lensing is a 30 year old [place] woman, originally from [place], status post first trimester dilatation and evacuation for molar pregnancy in early May 2012, under the care of Dr. Stefano Gaul.  Staging scan on February 21, 2011, showed evidence of multiple bilateral pulmonary nodules consistent with metastatic gestational trophoblastic disease and the patient was started on the EMA - CO. Currently due for admission for day 1 cycle 6, after 2-week delay secondary to afebrile, chemotherapy induced neutropenia, now resolved.  On day 1, the patient will receive etoposide at 100 mg per square  meter IV every 30 minutes, dactinomycin at 0.5 mg IV push, and methotrexate at 100 mg per square meter IV push, followed by 20 mg per square meter, continuous infusion for 12 hours.  Also receives folic acid at 15 mg p.o. q.12 h. x4 beginning 24 hours after the start of methotrexate.  On day 2, she will receive etoposide 100 mg per square meter over 30 minutes and dactinomycin at 0.5 mg IV push.  Premeds both days will include ondansetron and dexamethasone, given IV.  The patient will return to the cancer center on day 8 for cyclophosphamide and vincristine.  The plan is to complete a total of 8 cycles, which will be 3 cycles after normalization of the beta-hCG.  This case admission were reviewed with Dr. Pierce Crane today in Dr. Darrall Dears absence.     [name], PA   ______________________________ Lowella Dell, M.D.    AGB/MEDQ  D:  06/04/2011  T:  06/04/2011  Job:  [id number]  cc:   Laurette Schimke, MD  Janine Limbo, M.D. Fax: [phone]  Electronically Signed by  Ruthann Cancer M.D. on 07/02/2011 08:11:57 AM

## 2011-07-03 ENCOUNTER — Inpatient Hospital Stay (HOSPITAL_COMMUNITY): Payer: PRIVATE HEALTH INSURANCE

## 2011-07-03 LAB — DIFFERENTIAL
Eosinophils Relative: 0 % (ref 0–5)
Lymphocytes Relative: 6 % — ABNORMAL LOW (ref 12–46)
Lymphs Abs: 0.4 10*3/uL — ABNORMAL LOW (ref 0.7–4.0)
Neutro Abs: 5 10*3/uL (ref 1.7–7.7)
Neutrophils Relative %: 90 % — ABNORMAL HIGH (ref 43–77)

## 2011-07-03 LAB — CBC
HCT: 28 % — ABNORMAL LOW (ref 36.0–46.0)
Hemoglobin: 9 g/dL — ABNORMAL LOW (ref 12.0–15.0)
MCV: 79.3 fL (ref 78.0–100.0)
RBC: 3.53 MIL/uL — ABNORMAL LOW (ref 3.87–5.11)
WBC: 5.5 10*3/uL (ref 4.0–10.5)

## 2011-07-03 NOTE — Discharge Summary (Signed)
[name], [name]                   ACCOUNT NO.:  [id number]  MEDICAL RECORD NO.:  [id number]  LOCATION:  1303                         FACILITY:  [facility]  PHYSICIAN:  Lowella Dell, M.D.DATE OF BIRTH:  1980/12/18  DATE OF ADMISSION:  07/02/2011 DATE OF DISCHARGE:                              DISCHARGE SUMMARY   DISCHARGE DIAGNOSES: 1. High-risk molar pregnancy(gestational trophoblastic neoplasia). 2. Cytopenia secondary to chemotherapy. 3. Hypokalemia. 4. Moderately controlled nausea. 5. Elevated transaminases, improved.  PROCEDURES:  Intravenous chemotherapy.  HOSPITAL COURSE:  The patient was admitted on July 02, 2011, for her second of 3 planned postremission chemotherapy cycles with EMA-CO.  On admission, the patient's vitals were normal for her and admission lab work showed her liver function tests which had been elevated previously to have largely normalized.  Specifically, the total bilirubin was 0.2, alkaline phosphatase 71, AST 26, and ALT was down to 51.  Accordingly, a planned abdominal MRI was canceled.  We also obtained a repeat beta HCG at the time of admission and it continues to be low at 1.  The patient was then premedicated with ondansetron 8 mg and dexamethasone 20 mg, and proceeded to receive her second cycle of chemotherapy at doses identical to her first postremission cycle, namely the etoposide at 100 mg per meters squared for a total dose of 140 mg on days 1 and 2, actinomycin D at 0.5 mg on days 1 and 2, methotrexate 100 mg per meters squared on day 1, IV push followed by 200 mg per meters squared by continuous infusion over 12 hours.  The patient will be starting leucovorin 24 hours after the start of methotrexate which will be at 7:00 p.m. on the day of discharge.  The patient is tolerating treatment well except for nausea and we are adding dexamethasone and running metoclopramide to her medications in an attempt to bring this under better  control.  Otherwise, at the time of this dictation, her temperature is 98.2, pulse 91, respiratory rate 16, blood pressure stable at 99/67 and her room air saturation is 99%.  The oropharynx is clear.  I do not palpate any peripheral adenopathy.  Lungs show no crackles or wheezes.  Heart regular rate and rhythm.  Abdomen: Soft, nontender, positive bowel sounds.  No peripheral edema.  Additional lab work obtained, this admission included a white cell count of 5.5, hemoglobin 9.0, MCV 79.3 (the patient has presumed thalassemia diagnosis) and a platelet count of 334,000.  The absolute neutrophil count was 5.  The creatinine was 0.62, potassium 3.4.  Other electrolytes in the normal range, and specifically albumin 3.5 and calcium 9.2.  A chest x-ray is pending at the time of this dictation.  CONDITION AT DISCHARGE:  Stable.  DIET:  As tolerated.  ACTIVITY:  As tolerated.  WOUND CARE:  Not applicable.  MEDICATIONS:  I will include: 1. Dexamethasone 2 mg twice daily for nausea and the patient has a     prescription for this. 2. She will also take Advil 200 mg 1 or 2 tablets every 8 hours as     needed for pain. 3. She will use EMLA cream as directed. 4.  She will take leucovorin 10 mg tablets 1-1/2 tablet 3 times a day     beginning, 7 p.m. on October 11. 5. Metoclopramide 5 mg before meals and at bedtime for the next 48     hours. 6. Ondansetron 8 mg twice daily and she also was given a prescription     for this. 7. Potassium chloride 20 mEq 3 times a day.  FOLLOWUP:  The patient has an appointment in our office on July 09, 2011 at 11:15 in the morning to receive the second (day #8) portion of this second cycle of postremission chemotherapy.     Lowella Dell, M.D.     [name]  D:  07/03/2011  T:  07/03/2011  Job:  [id number]  cc:   Janine Limbo, M.D. Fax: [phone]  De Blanch, M.D.  Electronically Signed by Ruthann Cancer M.D. on 07/03/2011  12:55:22 PM

## 2011-07-04 DIAGNOSIS — O019 Hydatidiform mole, unspecified: Secondary | ICD-10-CM

## 2011-07-09 ENCOUNTER — Other Ambulatory Visit: Payer: Self-pay | Admitting: Oncology

## 2011-07-09 ENCOUNTER — Encounter (HOSPITAL_BASED_OUTPATIENT_CLINIC_OR_DEPARTMENT_OTHER): Payer: Medicaid Other | Admitting: Oncology

## 2011-07-09 DIAGNOSIS — K1370 Unspecified lesions of oral mucosa: Secondary | ICD-10-CM

## 2011-07-09 DIAGNOSIS — O019 Hydatidiform mole, unspecified: Secondary | ICD-10-CM

## 2011-07-09 DIAGNOSIS — T451X5A Adverse effect of antineoplastic and immunosuppressive drugs, initial encounter: Secondary | ICD-10-CM

## 2011-07-09 DIAGNOSIS — R112 Nausea with vomiting, unspecified: Secondary | ICD-10-CM

## 2011-07-09 DIAGNOSIS — Z5111 Encounter for antineoplastic chemotherapy: Secondary | ICD-10-CM

## 2011-07-09 DIAGNOSIS — L738 Other specified follicular disorders: Secondary | ICD-10-CM

## 2011-07-09 DIAGNOSIS — D392 Neoplasm of uncertain behavior of placenta: Secondary | ICD-10-CM

## 2011-07-09 LAB — CBC WITH DIFFERENTIAL/PLATELET
Basophils Absolute: 0 10*3/uL (ref 0.0–0.1)
Eosinophils Absolute: 0 10*3/uL (ref 0.0–0.5)
HCT: 30.5 % — ABNORMAL LOW (ref 34.8–46.6)
HGB: 10.2 g/dL — ABNORMAL LOW (ref 11.6–15.9)
LYMPH%: 7.8 % — ABNORMAL LOW (ref 14.0–49.7)
MCV: 78.9 fL — ABNORMAL LOW (ref 79.5–101.0)
MONO#: 0 10*3/uL — ABNORMAL LOW (ref 0.1–0.9)
MONO%: 0.1 % (ref 0.0–14.0)
NEUT#: 3.4 10*3/uL (ref 1.5–6.5)
Platelets: 191 10*3/uL (ref 145–400)
WBC: 3.7 10*3/uL — ABNORMAL LOW (ref 3.9–10.3)

## 2011-07-09 LAB — COMPREHENSIVE METABOLIC PANEL
Albumin: 4 g/dL (ref 3.5–5.2)
Alkaline Phosphatase: 72 U/L (ref 39–117)
BUN: 12 mg/dL (ref 6–23)
CO2: 25 mEq/L (ref 19–32)
Glucose, Bld: 118 mg/dL — ABNORMAL HIGH (ref 70–99)
Potassium: 3.5 mEq/L (ref 3.5–5.3)
Total Bilirubin: 0.7 mg/dL (ref 0.3–1.2)
Total Protein: 7.8 g/dL (ref 6.0–8.3)

## 2011-07-12 LAB — BETA HCG QUANT (REF LAB): Beta hCG, Tumor Marker: 0.8 m[IU]/mL (ref <5.0)

## 2011-07-16 ENCOUNTER — Other Ambulatory Visit: Payer: Self-pay | Admitting: Oncology

## 2011-07-16 ENCOUNTER — Encounter (HOSPITAL_BASED_OUTPATIENT_CLINIC_OR_DEPARTMENT_OTHER): Payer: PRIVATE HEALTH INSURANCE | Admitting: Oncology

## 2011-07-16 ENCOUNTER — Encounter (HOSPITAL_COMMUNITY)
Admit: 2011-07-16 | Discharge: 2011-07-16 | Disposition: A | Discharge status: Home / Self Care | Payer: PRIVATE HEALTH INSURANCE | Attending: Oncology | Admitting: Oncology

## 2011-07-16 ENCOUNTER — Inpatient Hospital Stay (HOSPITAL_COMMUNITY)
Admission: AD | Admit: 2011-07-16 | Discharge: 2011-07-21 | DRG: 809 | Disposition: A | Discharge status: Home / Self Care | Payer: PRIVATE HEALTH INSURANCE | Source: Ambulatory Visit | Attending: Oncology | Admitting: Oncology

## 2011-07-16 DIAGNOSIS — R5081 Fever presenting with conditions classified elsewhere: Secondary | ICD-10-CM | POA: Diagnosis present

## 2011-07-16 DIAGNOSIS — C58 Malignant neoplasm of placenta: Secondary | ICD-10-CM | POA: Diagnosis present

## 2011-07-16 DIAGNOSIS — R1310 Dysphagia, unspecified: Secondary | ICD-10-CM | POA: Diagnosis not present

## 2011-07-16 DIAGNOSIS — L678 Other hair color and hair shaft abnormalities: Secondary | ICD-10-CM

## 2011-07-16 DIAGNOSIS — R197 Diarrhea, unspecified: Secondary | ICD-10-CM | POA: Diagnosis not present

## 2011-07-16 DIAGNOSIS — B379 Candidiasis, unspecified: Secondary | ICD-10-CM | POA: Diagnosis present

## 2011-07-16 DIAGNOSIS — C7800 Secondary malignant neoplasm of unspecified lung: Secondary | ICD-10-CM | POA: Diagnosis present

## 2011-07-16 DIAGNOSIS — L738 Other specified follicular disorders: Secondary | ICD-10-CM

## 2011-07-16 DIAGNOSIS — D649 Anemia, unspecified: Secondary | ICD-10-CM | POA: Diagnosis present

## 2011-07-16 DIAGNOSIS — D709 Neutropenia, unspecified: Principal | ICD-10-CM | POA: Diagnosis present

## 2011-07-16 DIAGNOSIS — O0289 Other abnormal products of conception: Secondary | ICD-10-CM

## 2011-07-16 DIAGNOSIS — I959 Hypotension, unspecified: Secondary | ICD-10-CM | POA: Diagnosis present

## 2011-07-16 DIAGNOSIS — Z9221 Personal history of antineoplastic chemotherapy: Secondary | ICD-10-CM

## 2011-07-16 DIAGNOSIS — E876 Hypokalemia: Secondary | ICD-10-CM | POA: Diagnosis present

## 2011-07-16 LAB — CBC WITH DIFFERENTIAL/PLATELET
HGB: 7.5 g/dL — ABNORMAL LOW (ref 11.6–15.9)
MCV: 73.5 fL — ABNORMAL LOW (ref 79.5–101.0)
RBC: 3.1 10*6/uL — ABNORMAL LOW (ref 3.70–5.45)
RDW: 15.4 % — ABNORMAL HIGH (ref 11.2–14.5)
WBC: 1 10*3/uL — ABNORMAL LOW (ref 3.9–10.3)

## 2011-07-16 LAB — MANUAL DIFFERENTIAL
ALC: 0.2 10*3/uL — ABNORMAL LOW (ref 0.9–3.3)
Band Neutrophils: 0 % (ref 0–10)
Blasts: 2 % — ABNORMAL HIGH (ref 0–0)
EOS: 2 % (ref 0–7)
Myelocytes: 0 % (ref 0–0)
Other Cell: 0 % (ref 0–0)
PROMYELO: 0 % (ref 0–0)
SEG: 5 % — ABNORMAL LOW (ref 38–77)
Variant Lymph: 0 % (ref 0–0)
nRBC: 6 % — ABNORMAL HIGH (ref 0–0)

## 2011-07-16 LAB — COMPREHENSIVE METABOLIC PANEL
AST: 18 U/L (ref 0–37)
Albumin: 3.4 g/dL — ABNORMAL LOW (ref 3.5–5.2)
BUN: 6 mg/dL (ref 6–23)
CO2: 23 mEq/L (ref 19–32)
Calcium: 8.7 mg/dL (ref 8.4–10.5)
Chloride: 93 mEq/L — ABNORMAL LOW (ref 96–112)
Creatinine, Ser: 0.66 mg/dL (ref 0.50–1.10)
Glucose, Bld: 115 mg/dL — ABNORMAL HIGH (ref 70–99)
Potassium: 2.6 mEq/L — CL (ref 3.5–5.3)

## 2011-07-17 ENCOUNTER — Inpatient Hospital Stay (HOSPITAL_COMMUNITY): Payer: PRIVATE HEALTH INSURANCE

## 2011-07-17 LAB — URINALYSIS, ROUTINE W REFLEX MICROSCOPIC
Glucose, UA: NEGATIVE mg/dL
Protein, ur: 30 mg/dL — AB
Specific Gravity, Urine: 1.027 (ref 1.005–1.030)
Urobilinogen, UA: 1 mg/dL (ref 0.0–1.0)

## 2011-07-17 LAB — URINE MICROSCOPIC-ADD ON

## 2011-07-17 LAB — BASIC METABOLIC PANEL
BUN: 4 mg/dL — ABNORMAL LOW (ref 6–23)
Chloride: 99 mEq/L (ref 96–112)
GFR calc non Af Amer: >90 mL/min (ref >90)
Glucose, Bld: 160 mg/dL — ABNORMAL HIGH (ref 70–99)
Potassium: 2.6 mEq/L — CL (ref 3.5–5.1)
Sodium: 132 mEq/L — ABNORMAL LOW (ref 135–145)

## 2011-07-17 LAB — CBC
HCT: 17.9 % — ABNORMAL LOW (ref 36.0–46.0)
Hemoglobin: 5.9 g/dL — CL (ref 12.0–15.0)
MCHC: 33 g/dL (ref 30.0–36.0)
RBC: 2.38 MIL/uL — ABNORMAL LOW (ref 3.87–5.11)
WBC: 0.9 10*3/uL — CL (ref 4.0–10.5)

## 2011-07-18 ENCOUNTER — Inpatient Hospital Stay (HOSPITAL_COMMUNITY): Payer: PRIVATE HEALTH INSURANCE

## 2011-07-18 LAB — CROSSMATCH
ABO/RH(D): B POS
Antibody Screen: NEGATIVE
Unit division: 0
Unit division: 0

## 2011-07-18 LAB — COMPREHENSIVE METABOLIC PANEL
ALT: 28 U/L (ref 0–35)
Alkaline Phosphatase: 69 U/L (ref 39–117)
BUN: 3 mg/dL — ABNORMAL LOW (ref 6–23)
CO2: 23 mEq/L (ref 19–32)
GFR calc Af Amer: >90 mL/min (ref >90)
GFR calc non Af Amer: >90 mL/min (ref >90)
Glucose, Bld: 112 mg/dL — ABNORMAL HIGH (ref 70–99)
Potassium: 2.9 mEq/L — ABNORMAL LOW (ref 3.5–5.1)
Sodium: 138 mEq/L (ref 135–145)
Total Protein: 5.3 g/dL — ABNORMAL LOW (ref 6.0–8.3)

## 2011-07-18 LAB — DIFFERENTIAL
Basophils Absolute: 0 10*3/uL (ref 0.0–0.1)
Eosinophils Absolute: 0 10*3/uL (ref 0.0–0.7)
Lymphs Abs: 0.4 10*3/uL — ABNORMAL LOW (ref 0.7–4.0)
Metamyelocytes Relative: 1 %
Monocytes Absolute: 0.9 10*3/uL (ref 0.1–1.0)
Neutro Abs: 0.8 10*3/uL — ABNORMAL LOW (ref 1.7–7.7)
Neutrophils Relative %: 28 % — ABNORMAL LOW (ref 43–77)

## 2011-07-18 LAB — CBC
HCT: 27.9 % — ABNORMAL LOW (ref 36.0–46.0)
MCH: 26.4 pg (ref 26.0–34.0)
MCHC: 34.1 g/dL (ref 30.0–36.0)
MCV: 77.5 fL — ABNORMAL LOW (ref 78.0–100.0)
Platelets: 202 10*3/uL (ref 150–400)
RDW: 14.3 % (ref 11.5–15.5)

## 2011-07-18 LAB — URINE CULTURE
Colony Count: NO GROWTH
Culture  Setup Time: 201210251013
Special Requests: NEGATIVE

## 2011-07-19 DIAGNOSIS — D392 Neoplasm of uncertain behavior of placenta: Secondary | ICD-10-CM

## 2011-07-19 DIAGNOSIS — R509 Fever, unspecified: Secondary | ICD-10-CM

## 2011-07-19 DIAGNOSIS — D709 Neutropenia, unspecified: Secondary | ICD-10-CM

## 2011-07-19 LAB — CBC
MCHC: 33.6 g/dL (ref 30.0–36.0)
Platelets: 231 10*3/uL (ref 150–400)
RDW: 15.1 % (ref 11.5–15.5)

## 2011-07-19 LAB — BASIC METABOLIC PANEL
GFR calc Af Amer: >90 mL/min (ref >90)
GFR calc non Af Amer: >90 mL/min (ref >90)
Potassium: 3.4 mEq/L — ABNORMAL LOW (ref 3.5–5.1)
Sodium: 140 mEq/L (ref 135–145)

## 2011-07-19 LAB — BETA HCG QUANT (REF LAB): Beta hCG, Tumor Marker: 0.6 m[IU]/mL (ref <5.0)

## 2011-07-19 LAB — DIFFERENTIAL
Basophils Relative: 1 % (ref 0–1)
Eosinophils Absolute: 0 10*3/uL (ref 0.0–0.7)
Lymphs Abs: 0.5 10*3/uL — ABNORMAL LOW (ref 0.7–4.0)
Monocytes Absolute: 0.6 10*3/uL (ref 0.1–1.0)
Neutrophils Relative %: 50 % (ref 43–77)

## 2011-07-20 ENCOUNTER — Inpatient Hospital Stay (HOSPITAL_COMMUNITY): Payer: PRIVATE HEALTH INSURANCE

## 2011-07-20 LAB — DIFFERENTIAL
Eosinophils Absolute: 0.1 10*3/uL (ref 0.0–0.7)
Lymphs Abs: 0.5 10*3/uL — ABNORMAL LOW (ref 0.7–4.0)
Monocytes Absolute: 0.5 10*3/uL (ref 0.1–1.0)
Neutrophils Relative %: 56 % (ref 43–77)

## 2011-07-20 LAB — BASIC METABOLIC PANEL
Chloride: 105 mEq/L (ref 96–112)
Creatinine, Ser: 0.58 mg/dL (ref 0.50–1.10)
GFR calc Af Amer: >90 mL/min (ref >90)
GFR calc non Af Amer: >90 mL/min (ref >90)
Potassium: 3.7 mEq/L (ref 3.5–5.1)

## 2011-07-20 LAB — CBC
MCHC: 32.7 g/dL (ref 30.0–36.0)
MCV: 79.8 fL (ref 78.0–100.0)
Platelets: 266 10*3/uL (ref 150–400)
RDW: 15.6 % — ABNORMAL HIGH (ref 11.5–15.5)
WBC: 2.5 10*3/uL — ABNORMAL LOW (ref 4.0–10.5)

## 2011-07-20 MED ORDER — IOHEXOL 300 MG/ML  SOLN
80.0000 mL | Freq: Once | INTRAMUSCULAR | Status: AC | PRN
  Administered 2011-07-20: 80 mL via INTRAVENOUS

## 2011-07-21 ENCOUNTER — Other Ambulatory Visit (HOSPITAL_COMMUNITY): Payer: PRIVATE HEALTH INSURANCE

## 2011-07-21 DIAGNOSIS — O0289 Other abnormal products of conception: Secondary | ICD-10-CM

## 2011-07-21 LAB — DIFFERENTIAL
Blasts: 0 %
Eosinophils Absolute: 0 10*3/uL (ref 0.0–0.7)
Eosinophils Relative: 1 % (ref 0–5)
Metamyelocytes Relative: 0 %
Myelocytes: 0 %
Neutro Abs: 1.7 10*3/uL (ref 1.7–7.7)
Neutrophils Relative %: 57 % (ref 43–77)
Promyelocytes Absolute: 0 %
nRBC: 0 /100 WBC

## 2011-07-21 LAB — BASIC METABOLIC PANEL
BUN: <3 mg/dL — ABNORMAL LOW (ref 6–23)
CO2: 27 mEq/L (ref 19–32)
Chloride: 104 mEq/L (ref 96–112)
Creatinine, Ser: 0.59 mg/dL (ref 0.50–1.10)
GFR calc Af Amer: >90 mL/min (ref >90)
Glucose, Bld: 99 mg/dL (ref 70–99)
Potassium: 4.1 mEq/L (ref 3.5–5.1)

## 2011-07-21 LAB — CBC
HCT: 31.2 % — ABNORMAL LOW (ref 36.0–46.0)
Hemoglobin: 10.2 g/dL — ABNORMAL LOW (ref 12.0–15.0)
MCV: 80.4 fL (ref 78.0–100.0)
RBC: 3.88 MIL/uL (ref 3.87–5.11)
WBC: 3 10*3/uL — ABNORMAL LOW (ref 4.0–10.5)

## 2011-07-21 NOTE — H&P (Signed)
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  Her original diagnosis was made by Dr. Kirkland [name], after which the patient underwent 1st trimester dilatation and evacuation in early May 2012. The pathology showed an 11-week molar pregnancy.  Preoperative beta hCG was 261,077.  This eventually increased to almost 400,000.  After evaluation in late May 2012, the patient was started on methotrexate and leucovorin.  Early scans in June, however, showed multiple bilateral pulmonary nodules and she was started on chemotherapy, EMA-CO.  Days 1 and 2 have been given on inpatient basis, day 8 as an outpatient.  The patient's beta hCG continued to drop, normalizing in late August at 4.0. She is now being treated with 3 postremission cycles of EMA-CO and is due for day 1 cycle 2 of 3 planned cycles today.  She has tolerated chemo well with the exception of some fatigue and occasional nausea.  She is feeling well today and really has no complaints.  Her admission was delayed by 1 week due to chemo-induced afebrile neutropenia last week, now resolved, with an ANC today of 1.7. She completed a course of prophylactic Cipro and denies any fevers, chills, or night sweats.  She has had no abdominal or pelvic pain.  No abnormal bleeding, and specifically no vaginal bleeding since her last.  ALLERGIES:  No known drug allergies.  PAST MEDICAL HISTORY: 1. 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 2. Remote cleft palate  repair. 3. Symptomatic chemo-induced anemia. 4. Hypokalemia, currently on oral supplementation. 5. History of chemo-induced leukopenia with afebrile neutropenia,     resolved.  CURRENT MEDICATIONS: 1. Metoclopramide 10 mg before meals and at bedtime. 2. Ondansetron 8 mg 2 to 3 times daily. 3. Advil 200 mg every 8 hours as needed for pain. 4. EMLA cream as directed. 5. Klor-Con/EF effervescent tablets, 25 mEq, 1 dissolved in beverage     p.o. t.i.d. with meals.  FAMILY HISTORY:  Noncontributory.  SOCIAL HISTORY:  The patient is married and lives with her husband, her parents, her brother and sister-in-law, and her brother's child.  There is no history of alcohol, tobacco, or illicit drug abuse.  REVIEW OF SYSTEMS:  As per HPI, the patient continues to complain of some mild fatigue.  She has some occasional lower back pain which has not worsened.  She denies any fevers, chills, or night sweats.  She has had no rashes, skin changes, or nail bed changes.  No signs of abnormal bleeding.  Appetite is fair.  She has occasional nausea which is well controlled with her antiemetics and denies any emesis.  No recent change in bowel or bladder habits.  No cough, phlegm production, or pleurisy. Mild shortness of breath with exertion alone.  No chest pain, pressure, or palpitations.  No abnormal headaches or dizziness.  No change in vision.  No unusual myalgias, arthralgias, bony pain, peripheral neuropathy, or peripheral swelling.  No mouth ulcers or oral sensitivity.  PHYSICAL EXAMINATION:  GENERAL:  Young [place] Asian woman, appearing her stated age of 30 years old, in no acute distress. VITAL SIGNS:  Blood pressure 116/75, pulse 103, respirations 20, temperature 98.7, weight 112.0 pounds, and height 58.5 inches. HEENT:  Sclerae anicteric.  Conjunctivae pale.  Oropharynx is benign with no mucositis or candidiasis. NECK:  Supple. NODES:  No cervical, supraclavicular, or axillary  lymphadenopathy. BREAST EXAM:  Deferred. CHEST:  Port is intact in the chest wall with no erythema, edema, or evidence of infection. LUNGS:  Clear to auscultation bilaterally.  No crackles, rhonchi, or wheezes. HEART:  Regular rhythm; mildly tachycardiac; with no gallops, murmurs, or rubs. ABDOMEN:  Soft, nontender.  Positive bowel sounds.  No hepatomegaly, splenomegaly, or masses palpated on exam. MUSCULOSKELETAL:  No focal spinal tenderness to palpation. SKIN:  Benign. NEUROLOGIC:  Nonfocal. EXTREMITIES:  No peripheral edema.  No peripheral cyanosis. GENITOURINARY:  Deferred.  LABS:  White count today of 3.0, ANC 1.7, hemoglobin 9.4, hematocrit 30.2, MCV 79.9, and platelets 316,000.  A CMET and beta hCG are both pending.  Most recent CMET drawn on June 25, 2011, was remarkable for a low potassium of 2.9, nonfasting glucose of 141, normal alkaline phosphatase of 62, normal total bilirubin of 0.4, but an elevated AST of 60 and an elevated ALT of 158.  Remainder of CMET was within normal limits.  Most recent beta hCG also drawn on June 25, 2011, was normal at 1.1.  FILMS/STUDIES:  Most recent chest x-ray obtained during recent hospitalization on September 19th showed continuing multiple bilateral pulmonary nodules, but significantly decreased as compared to prior films.  IMPRESSION AND PLAN:  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  She began chemotherapy on February 27, 2011, and is status post 5 cycles of EMA-CO before normalization of her beta hCG at 4.0 on May 14, 2011.  This was down from almost 400,000 in May 2012 at time of diagnosis.  The patient is now being admitted to [facility] for day 1 cycle 2 of 3 planned postremission cycles of EMA-CO, with days 1 and 2 given on inpatient basis, day 8 given as an outpatient.  [name]  will receive her 2nd of 3 planned postremission cycles of EMA-CO, beginning with admission today.  There is evidence of recent elevation in her liver enzymes as noted above, and we will evaluate this further during hospitalization with an MRI of the abdomen/liver with contrast. We will continue to follow her labs, including her potassium level as well.  PLAN:  To see her as an outpatient in 1 week to pursue day 8 cycle 2 as scheduled.  This admission and plan have been reviewed with Dr. Ruthann Cancer who voices his agreement.     Amy Allegra Grana, PA-C   ______________________________ [name], M.D.    AGB/MEDQ  D:  07/02/2011  T:  07/02/2011  Job:  [id number]  cc:   Janine Limbo, M.D. Fax: [phone]  Electronically Signed by Zollie Scale PA on 07/03/2011 12:58:32 PM Electronically Signed by Ruthann Cancer M.D. on 07/21/2011 07:26:13 AM

## 2011-07-21 NOTE — H&P (Signed)
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  The patient has received a total of 7 cycles, with the most recent chemotherapy dose on October 17.  She has had an excellent response with her beta HCG dropping from 400,000 to 1, and the lung lesions having cleared on her most recent chest x-ray.  The patient was seen at the urgent medical and family care on October 21 (Sunday).  At that time, she had a temperature of 99.9, a pulse of 134, blood pressure of 179/82, and complained of cold chills and a sore throat, with pain on swallowing.  A CBC was obtained, which showed 42,000 platelets, a hemoglobin of 7.7, and a white cell count of 400. She was given 1 g of Rocephin and instructed to come to our clinic the following day, October 22nd.  However, the patient already had an appointment with [place] today, October 24 and she waited until this afternoon to come in.  In the office, she was found to be febrile with a temperature of a 102.2, and her absolute neutrophil count was 0.  She is being admitted for fever workup and emergent treatment of fever and neutropenia.  Aside from the molar pregnancy, the patient's past medical history is significant for hypokalemia, remote history of cleft palate status post repair, and history of cytopenia secondary to chemotherapy.  ALLERGIES:  No known drug  allergies.  MEDICATIONS:  The patient uses EMLA cream topically for port access, takes potassium 20 mEq once a day, uses metoclopramide 5 mg a.c. and h.s. as needed for nausea, uses Colace as needed for constipation, and takes ibuprofen for pain.  GYNECOLOGIC HISTORY:  She is Gx P0.  The patient has irregular menstrual periods.  FAMILY HISTORY:  The patient's parents are both alive, age is 14 for the father and 20 for the mother.  The patient has 1 brother and no sisters.  SOCIAL HISTORY:  The patient works at [employer], Mondays through Saturdays.  Her husband is also originally from [place] and has a Holiday representative job.  The patient lives with her parents, her brother, and her brother's wife and child.  HEALTH MAINTENANCE:  There is no history of tobacco or EtOH abuse.  REVIEW OF SYSTEMS:  It has been painful to swallow for several days. She has had a little bit of a cough, but no phlegm production, no pleurisy, and no shortness of breath.  She denies unusual headaches, visual changes, neck stiffness, photophobia, nausea, or vomiting.  There has been no change in bowel or bladder habits, and she denies dysuria, hematuria, melena, or bright  red blood per rectum.  Aside from the fever and sore throat, she is not aware of palpitations as there has been no fainting, dizziness, or vertigo.  A detailed review of systems was otherwise noncontributory.  PHYSICAL EXAMINATION:  VITAL SIGNS:  In the office today, temperature was 102.7; heart rate initially 155, later 128; respirations 20, blood pressure 119/88, and weight 105 pounds. HEENT:  Sclerae not icteric.  She has thrush in the oropharynx.  I do not see ulceration.  I do not palpate any peripheral adenopathy. LUNGS:  Have no rales or rhonchi on auscultation. HEART:  Rapid rate, regular rhythm.  No murmur appreciated. BREAST EXAM:  Deferred. ABDOMEN:  Soft, nontender, positive bowel sounds. MUSCULOSKELETAL EXAM:  No peripheral  edema. NEUROLOGIC EXAM:  Nonfocal.  LABORATORY WORK:  In the office, showed a white cell count of 1, ANC of 0, hemoglobin 7.5, and platelets 168,000.  Lab work on October 17, showed a white cell count of 3.7, ANC 3.4, hemoglobin 10.2, and platelets 191,000.  On that day, creatinine was 7.4, liver function tests, albumin and calcium were normal.  Repeat C Met is pending.  The differential is consistent with stress marrow with occasional blasts seen in the peripheral blood and giant platelets (electrocardiogram shows sinus tachycardia at 128 beats per minute).  IMPRESSION AND PLAN:  A 30 year old [place] woman originally from [place] with a history of molar pregnancy evacuated on May 2012, status post 7 cycles of chemotherapy.  Most recent chemotherapy given on October 17 consisting of Cytoxan and vincristine, now with a sore throat, thrush, temperature of greater than 102, and no neutrophils on the peripheral blood.  We have access to the patient's port in the office.  Started IV fluids and written admission orders including stat Primaxin and vancomycin.  We have faxed the orders to pharmacy and alerted the floor nurse to the importance of getting these antibiotics into the patient as early as possible.  We have a C Met pending and we will obtain type and screen. The patient likely will need a transfusion later today.  I have written other orders including for supportive care, and we will obtain a repeat chest x-ray next morning likely a CT of the chest prior to the patient going home from this admission, which will likely occur early next week. If the patient's lungs have completely cleared, we will likely stop chemotherapy at this point.  The patient is a full code.     Lowella Dell, M.D.     [name]  D:  07/16/2011  T:  07/16/2011  Job:  [id number]  cc:   Janine Limbo, M.D. Fax: [phone]  Electronically Signed by Ruthann Cancer M.D. on 07/21/2011 07:26:15  AM

## 2011-07-23 ENCOUNTER — Encounter (HOSPITAL_BASED_OUTPATIENT_CLINIC_OR_DEPARTMENT_OTHER): Payer: PRIVATE HEALTH INSURANCE | Admitting: Oncology

## 2011-07-23 ENCOUNTER — Other Ambulatory Visit: Payer: Self-pay | Admitting: Oncology

## 2011-07-23 DIAGNOSIS — Z5111 Encounter for antineoplastic chemotherapy: Secondary | ICD-10-CM

## 2011-07-23 DIAGNOSIS — T451X5A Adverse effect of antineoplastic and immunosuppressive drugs, initial encounter: Secondary | ICD-10-CM

## 2011-07-23 DIAGNOSIS — L738 Other specified follicular disorders: Secondary | ICD-10-CM

## 2011-07-23 DIAGNOSIS — O019 Hydatidiform mole, unspecified: Secondary | ICD-10-CM

## 2011-07-23 DIAGNOSIS — K1370 Unspecified lesions of oral mucosa: Secondary | ICD-10-CM

## 2011-07-23 DIAGNOSIS — D6481 Anemia due to antineoplastic chemotherapy: Secondary | ICD-10-CM

## 2011-07-23 LAB — CBC WITH DIFFERENTIAL/PLATELET
BASO%: 0.6 % (ref 0.0–2.0)
Basophils Absolute: 0 10*3/uL (ref 0.0–0.1)
EOS%: 0.3 % (ref 0.0–7.0)
HCT: 37.1 % (ref 34.8–46.6)
HGB: 12.3 g/dL (ref 11.6–15.9)
LYMPH%: 14.7 % (ref 14.0–49.7)
MCH: 27 pg (ref 25.1–34.0)
MCHC: 33.1 g/dL (ref 31.5–36.0)
NEUT%: 71 % (ref 38.4–76.8)
Platelets: 362 10*3/uL (ref 145–400)
lymph#: 0.7 10*3/uL — ABNORMAL LOW (ref 0.9–3.3)

## 2011-07-23 LAB — COMPREHENSIVE METABOLIC PANEL
Albumin: 3.6 g/dL (ref 3.5–5.2)
Alkaline Phosphatase: 83 U/L (ref 39–117)
BUN: 9 mg/dL (ref 6–23)
Calcium: 9.7 mg/dL (ref 8.4–10.5)
Chloride: 100 mEq/L (ref 96–112)
Creatinine, Ser: 0.68 mg/dL (ref 0.50–1.10)
Glucose, Bld: 112 mg/dL — ABNORMAL HIGH (ref 70–99)
Potassium: 3.5 mEq/L (ref 3.5–5.3)

## 2011-07-28 LAB — CULTURE, BLOOD (SINGLE)

## 2011-07-31 ENCOUNTER — Other Ambulatory Visit (HOSPITAL_BASED_OUTPATIENT_CLINIC_OR_DEPARTMENT_OTHER): Payer: PRIVATE HEALTH INSURANCE | Admitting: Lab

## 2011-07-31 ENCOUNTER — Other Ambulatory Visit: Payer: Self-pay | Admitting: Oncology

## 2011-07-31 DIAGNOSIS — D392 Neoplasm of uncertain behavior of placenta: Secondary | ICD-10-CM

## 2011-07-31 LAB — CBC WITH DIFFERENTIAL/PLATELET
BASO%: 0.4 % (ref 0.0–2.0)
Eosinophils Absolute: 0 10*3/uL (ref 0.0–0.5)
MCHC: 33.8 g/dL (ref 31.5–36.0)
MONO#: 0.5 10*3/uL (ref 0.1–0.9)
NEUT#: 3.7 10*3/uL (ref 1.5–6.5)
RBC: 4.05 10*6/uL (ref 3.70–5.45)
WBC: 4.9 10*3/uL (ref 3.9–10.3)
lymph#: 0.6 10*3/uL — ABNORMAL LOW (ref 0.9–3.3)

## 2011-07-31 LAB — COMPREHENSIVE METABOLIC PANEL
ALT: 31 U/L (ref 0–35)
Albumin: 4.3 g/dL (ref 3.5–5.2)
CO2: 25 mEq/L (ref 19–32)
Calcium: 9.6 mg/dL (ref 8.4–10.5)
Chloride: 102 mEq/L (ref 96–112)
Potassium: 3.5 mEq/L (ref 3.5–5.3)
Sodium: 139 mEq/L (ref 135–145)
Total Bilirubin: 0.4 mg/dL (ref 0.3–1.2)
Total Protein: 7.4 g/dL (ref 6.0–8.3)

## 2011-08-04 LAB — COMPREHENSIVE METABOLIC PANEL
AST: 26 U/L (ref 0–37)
Alkaline Phosphatase: 65 U/L (ref 39–117)
Glucose, Bld: 89 mg/dL (ref 70–99)
Sodium: 139 mEq/L (ref 135–145)
Total Bilirubin: 0.4 mg/dL (ref 0.3–1.2)
Total Protein: 7.4 g/dL (ref 6.0–8.3)

## 2011-08-06 ENCOUNTER — Telehealth: Payer: Self-pay | Admitting: Oncology

## 2011-08-06 ENCOUNTER — Other Ambulatory Visit: Payer: Self-pay | Admitting: Oncology

## 2011-08-06 NOTE — Telephone Encounter (Signed)
called pts cellphone lmovm for appt on 11/15- 11/16 and to rtn call to confirm appt

## 2011-08-07 ENCOUNTER — Other Ambulatory Visit: Payer: Self-pay | Admitting: Oncology

## 2011-08-07 ENCOUNTER — Other Ambulatory Visit (HOSPITAL_BASED_OUTPATIENT_CLINIC_OR_DEPARTMENT_OTHER): Payer: PRIVATE HEALTH INSURANCE | Admitting: Lab

## 2011-08-07 DIAGNOSIS — D392 Neoplasm of uncertain behavior of placenta: Secondary | ICD-10-CM

## 2011-08-07 DIAGNOSIS — Z5111 Encounter for antineoplastic chemotherapy: Secondary | ICD-10-CM

## 2011-08-07 LAB — CBC WITH DIFFERENTIAL/PLATELET
Basophils Absolute: 0 10*3/uL (ref 0.0–0.1)
Eosinophils Absolute: 0.1 10*3/uL (ref 0.0–0.5)
LYMPH%: 13.9 % — ABNORMAL LOW (ref 14.0–49.7)
MCH: 28.1 pg (ref 25.1–34.0)
MCV: 85.1 fL (ref 79.5–101.0)
MONO%: 7.5 % (ref 0.0–14.0)
NEUT#: 3.5 10*3/uL (ref 1.5–6.5)
Platelets: 163 10*3/uL (ref 145–400)
RBC: 3.91 10*6/uL (ref 3.70–5.45)

## 2011-08-08 ENCOUNTER — Encounter: Payer: Self-pay | Admitting: *Deleted

## 2011-08-08 ENCOUNTER — Ambulatory Visit: Payer: PRIVATE HEALTH INSURANCE | Admitting: Oncology

## 2011-08-10 LAB — COMPREHENSIVE METABOLIC PANEL
Alkaline Phosphatase: 64 U/L (ref 39–117)
BUN: 16 mg/dL (ref 6–23)
Glucose, Bld: 83 mg/dL (ref 70–99)
Sodium: 138 mEq/L (ref 135–145)
Total Bilirubin: 0.5 mg/dL (ref 0.3–1.2)

## 2011-08-11 ENCOUNTER — Telehealth: Payer: Self-pay | Admitting: Oncology

## 2011-08-11 ENCOUNTER — Ambulatory Visit (HOSPITAL_BASED_OUTPATIENT_CLINIC_OR_DEPARTMENT_OTHER): Payer: PRIVATE HEALTH INSURANCE | Admitting: Oncology

## 2011-08-11 ENCOUNTER — Other Ambulatory Visit: Payer: Self-pay | Admitting: Oncology

## 2011-08-11 ENCOUNTER — Ambulatory Visit: Payer: PRIVATE HEALTH INSURANCE

## 2011-08-11 VITALS — BP 120/82 | HR 116 | Temp 98.4°F | Ht 58.5 in | Wt 109.5 lb

## 2011-08-11 DIAGNOSIS — O0289 Other abnormal products of conception: Secondary | ICD-10-CM

## 2011-08-11 DIAGNOSIS — O020 Blighted ovum and nonhydatidiform mole: Secondary | ICD-10-CM

## 2011-08-11 NOTE — Telephone Encounter (Signed)
Gv pt appt for nov-dec2012.  scheduld u/s for 11/23 @ 9am @ [facility].

## 2011-08-11 NOTE — Progress Notes (Signed)
ID: Caron Presume   Interval History: Ms. 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 We had been following her with weekly lab work after completing her chemotherapy 07/09/2011. We have been following her beta hCG weekly. On October 24 it was 0.6 on October 31 of 0.9 however on October 8 it was 33.5. She comes today to discuss this results.  ROS: She is back to work full-time. When she gets home she mostly just watches TV. She does not have a lot of energy left to do any kind of walking or other exercise. She describes herself as "just a little short of breath" winded walking. Otherwise there have been no unusual headaches cough phlegm production pleurisy shortness of breath difficulty swallowing or pain on swallowing change in bowel or bladder habits rash fever bleeding or other systemic symptoms. A detailed review of systems was otherwise noncontributory.  Medications: I have reviewed the patient's current medications.   Current Outpatient Prescriptions  Medication Sig Dispense Refill  . metoCLOPramide (REGLAN) 10 MG tablet Take 10 mg by mouth 4 (four) times daily.        . potassium bicarbonate (K-LYTE) 25 MEQ disintegrating tablet Take 25 mEq by mouth 2 (two) times daily.        . potassium chloride (KLOR-CON) 20 MEQ packet Take 20 mEq by mouth 2 (two) times daily.           Objective: Vital signs in last 24 hours: BP 120/82  Pulse 116  Temp(Src) 98.4 F (36.9 C) (Oral)  Ht 4' 10.5" (1.486 m)  Wt 109 lb 8 oz (49.669 kg)  BMI 22.50 kg/m2   Physical Exam:    Sclerae unicteric  Oropharynx clear  No peripheral adenopathy  Lungs clear -- no rales or rhonchi  Heart regular rate and rhythm  Abdomen benign, no masses or distension  MSK no focal spinal tenderness, no peripheral edema  Neuro nonfocal    Lab Results:   CMP  Lab Results  Component Value Date   GLUCOSE 83 08/07/2011   GLUCOSE 83 08/07/2011   ALT 29 08/07/2011   ALT 29 08/07/2011   AST 26 08/07/2011   AST 26 08/07/2011   NA 138 08/07/2011   NA 138 08/07/2011   K 3.8 08/07/2011   K 3.8 08/07/2011   CL 103 08/07/2011   CL 103 08/07/2011   CREATININE 0.64 08/07/2011   CREATININE 0.64 08/07/2011   BUN 16 08/07/2011   BUN 16 08/07/2011   CO2 25 08/07/2011   CO2 25 08/07/2011     Lab Results  Component Value Date   WBC 4.5 08/07/2011   HGB 11.0* 08/07/2011   HCT 33.3* 08/07/2011   MCV 85.1 08/07/2011   PLT 163 08/07/2011        Studies/Results: No results found.  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  Other problems include a history of hypokalemia, anemia, and significant cytopenias towards the latter courses of chemotherapy requiring treatment delays.    Plan: We had a long discussion today regarding of what this lab may mean. We certainly have to repeated. Am also setting her up for a transvaginal ultrasound to see if we need to consider hysterectomy. I have made her  a return appointment next week for likely readmission. If we do see a trend upward in her beta hCG she would be treated with the same day 1 agents, namely etoposide methotrexate and actinomycin D, but the day 8 agents would be changed to cisplatin and etoposide.  Nattaly Yebra C 08/11/2011

## 2011-08-12 LAB — BETA HCG QUANT (REF LAB)

## 2011-08-13 ENCOUNTER — Ambulatory Visit (HOSPITAL_COMMUNITY)
Admission: RE | Admit: 2011-08-13 | Discharge: 2011-08-13 | Disposition: A | Discharge status: Home / Self Care | Payer: PRIVATE HEALTH INSURANCE | Source: Ambulatory Visit | Attending: Oncology | Admitting: Oncology

## 2011-08-13 ENCOUNTER — Other Ambulatory Visit: Payer: Self-pay | Admitting: Oncology

## 2011-08-13 DIAGNOSIS — O0289 Other abnormal products of conception: Secondary | ICD-10-CM | POA: Insufficient documentation

## 2011-08-13 DIAGNOSIS — O020 Blighted ovum and nonhydatidiform mole: Secondary | ICD-10-CM

## 2011-08-14 NOTE — Discharge Summary (Signed)
[name], [name]                   ACCOUNT NO.:  [id number]  MEDICAL RECORD NO.:  [id number]  LOCATION:  1301                         FACILITY:  [facility]  PHYSICIAN:  Lowella Dell, M.D.DATE OF BIRTH:  09/17/1981  DATE OF ADMISSION:  07/16/2011 DATE OF DISCHARGE:  07/21/2011                              DISCHARGE SUMMARY   DISCHARGE DIAGNOSES: 1. Fever with neutropenia. 2. Poorly-controlled pain. 3. 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 4. Hypokalemia. 5. Anemia requiring transfusion. 6. Remote history of cleft palate, status post repair.  PROCEDURES: 1. Intravenous antibiotics. 2. Transfusion of blood products. 3. CT scan of the chest.  HOSPITAL COURSE:  The patient was admitted on October 24 with a temperature greater than 102 degrees and an absolute neutrophil count of 0.  She was treated with Primaxin and vancomycin intravenously. Urine culture was obtained at the time of admission and it was negative. The patient had 2 blood cultures obtained in the office, so far negative.  The patient's fever gradually decreased and she has been afebrile since October 27.  The patient was also borderline hypotensive on admission.  She was aggressively hydrated.  With hydration, the patient's hemoglobin dropped to 5.9 on October 25. She was transfused 2 units of packed red cells and the next day, hemoglobin was up to 9.5.  At the time of discharge, hemoglobin is 10.2 without further transfusions.  The patient's white cell count has gradually increased and at the time of discharge, her absolute neutrophil count is 1.7.  It has been greater than 1000 for the past 48 hours.  In addition to the problems with fever and neutropenia, the patient had a yeast infection.  This was treated with intravenous and then oral Diflucan and her thrush has cleared.  The patient has been hypokalemic.  We have replaced her  potassium both orally and intravenously and at the time of discharge, the patient's potassium was 4.1, creatinine was 0.59.  The patient has had pulmonary involvement with her trophoblastic disease.  A CT of the chest was obtained on October 26 to restage.  This shows continuing improvement with the largest mass currently measuring 9- mm down from 1.6-mm.  However, there has not been a complete clearance.  At the time of discharge, the patient is alert.  Her pain, which required morphine (primarily sore throat) has largely resolved, and she is now able to take oral medications as well as hydrate herself and nourish herself by mouth without major difficulty.  PHYSICAL EXAMINATION:  CURRENT VITAL SIGNS:  Show a temperature of 98.1, pulse 84, respiratory rate 18, and blood pressure 93/60, which is average for this patient, oxygen saturation on room air is 98%. HEENT:  Oropharynx is clear.  I do not palpate any peripheral adenopathy. LUNGS:  No crackles or wheezes. HEART:  Regular rate and rhythm.  No murmur appreciated. ABDOMEN:  Benign.  No masses palpated.  Positive bowel sounds. MUSCULOSKELETAL EXAM:  No focal spinal tenderness.  No peripheral edema. NEUROLOGIC EXAM:  Nonfocal. SKIN EXAM:  No rash.  LABS:  Show a total  white cell count of 3.0, hemoglobin 10.2, platelets 283,000, potassium 4.1, creatinine 0.59.  CONDITION AT DISCHARGE:  Improved.  DISCHARGE MEDICATIONS: 1. Tylenol 1-2 tablets every 4 hours as needed. 2. Tramadol 50 mg four times a day as needed for pain. 3. Advil 200 mg 1-2 tablets every 8 hours as needed. 4. EMLA cream topically pre-chemo. 5. Leucovorin post-chemo (not currently needed). 6. Metoclopramide 10 mg one-half to one tablet before meals as needed     for nausea. 7. Ondansetron ODT 8 mg as needed for nausea.  DIET:  Regular.  ACTIVITY:  As tolerated.  WOUND CARE:  Not applicable.  FOLLOW-UP:  The patient will see [place] again in the office on  October 31 as already scheduled.  At that point, we will set her up for a PET scan to help [place] make a decision whether we need to proceed to further chemotherapy or can start observation at this point.     Lowella Dell, M.D.     [name]  D:  07/21/2011  T:  07/21/2011  Job:  [id number]  cc:   Janine Limbo, M.D. Fax: [phone]  Electronically Signed by Ruthann Cancer M.D. on 08/14/2011 11:22:14 AM

## 2011-08-15 ENCOUNTER — Other Ambulatory Visit: Payer: PRIVATE HEALTH INSURANCE | Admitting: Lab

## 2011-08-15 LAB — BETA HCG QUANT (REF LAB): Beta hCG, Tumor Marker: <0.5 m[IU]/mL (ref <5.0)

## 2011-08-18 ENCOUNTER — Encounter: Payer: Self-pay | Admitting: *Deleted

## 2011-08-22 ENCOUNTER — Other Ambulatory Visit: Payer: Self-pay | Admitting: Oncology

## 2011-08-22 ENCOUNTER — Other Ambulatory Visit (HOSPITAL_BASED_OUTPATIENT_CLINIC_OR_DEPARTMENT_OTHER): Payer: PRIVATE HEALTH INSURANCE

## 2011-08-22 DIAGNOSIS — O019 Hydatidiform mole, unspecified: Secondary | ICD-10-CM

## 2011-08-22 DIAGNOSIS — D392 Neoplasm of uncertain behavior of placenta: Secondary | ICD-10-CM

## 2011-08-22 DIAGNOSIS — T451X5A Adverse effect of antineoplastic and immunosuppressive drugs, initial encounter: Secondary | ICD-10-CM

## 2011-08-22 DIAGNOSIS — E876 Hypokalemia: Secondary | ICD-10-CM

## 2011-08-22 LAB — CBC WITH DIFFERENTIAL/PLATELET
BASO%: 0.2 % (ref 0.0–2.0)
Basophils Absolute: 0 10*3/uL (ref 0.0–0.1)
EOS%: 2.9 % (ref 0.0–7.0)
HGB: 11.7 g/dL (ref 11.6–15.9)
MCH: 29.2 pg (ref 25.1–34.0)
RDW: 17 % — ABNORMAL HIGH (ref 11.2–14.5)
lymph#: 0.9 10*3/uL (ref 0.9–3.3)

## 2011-08-25 LAB — COMPREHENSIVE METABOLIC PANEL
ALT: 26 U/L (ref 0–35)
AST: 26 U/L (ref 0–37)
Albumin: 4.1 g/dL (ref 3.5–5.2)
BUN: 15 mg/dL (ref 6–23)
Calcium: 8.6 mg/dL (ref 8.4–10.5)
Chloride: 105 mEq/L (ref 96–112)
Potassium: 3.5 mEq/L (ref 3.5–5.3)
Sodium: 137 mEq/L (ref 135–145)
Total Protein: 6.9 g/dL (ref 6.0–8.3)

## 2011-08-28 ENCOUNTER — Ambulatory Visit (HOSPITAL_BASED_OUTPATIENT_CLINIC_OR_DEPARTMENT_OTHER): Payer: PRIVATE HEALTH INSURANCE | Admitting: Oncology

## 2011-08-28 ENCOUNTER — Other Ambulatory Visit: Payer: Self-pay | Admitting: Oncology

## 2011-08-28 ENCOUNTER — Telehealth: Payer: Self-pay | Admitting: Oncology

## 2011-08-28 ENCOUNTER — Other Ambulatory Visit (HOSPITAL_BASED_OUTPATIENT_CLINIC_OR_DEPARTMENT_OTHER): Payer: PRIVATE HEALTH INSURANCE | Admitting: Lab

## 2011-08-28 VITALS — BP 124/80 | HR 105 | Temp 98.0°F | Wt 110.9 lb

## 2011-08-28 DIAGNOSIS — C801 Malignant (primary) neoplasm, unspecified: Secondary | ICD-10-CM

## 2011-08-28 DIAGNOSIS — T451X5A Adverse effect of antineoplastic and immunosuppressive drugs, initial encounter: Secondary | ICD-10-CM

## 2011-08-28 DIAGNOSIS — E876 Hypokalemia: Secondary | ICD-10-CM

## 2011-08-28 DIAGNOSIS — O019 Hydatidiform mole, unspecified: Secondary | ICD-10-CM

## 2011-08-28 DIAGNOSIS — Z5111 Encounter for antineoplastic chemotherapy: Secondary | ICD-10-CM

## 2011-08-28 DIAGNOSIS — D392 Neoplasm of uncertain behavior of placenta: Secondary | ICD-10-CM

## 2011-08-28 LAB — COMPREHENSIVE METABOLIC PANEL
Albumin: 3.5 g/dL (ref 3.5–5.2)
CO2: 25 mEq/L (ref 19–32)
Calcium: 9 mg/dL (ref 8.4–10.5)
Chloride: 102 mEq/L (ref 96–112)
Glucose, Bld: 114 mg/dL — ABNORMAL HIGH (ref 70–99)
Potassium: 3.2 mEq/L — ABNORMAL LOW (ref 3.5–5.3)
Sodium: 137 mEq/L (ref 135–145)
Total Bilirubin: 0.4 mg/dL (ref 0.3–1.2)
Total Protein: 7.1 g/dL (ref 6.0–8.3)

## 2011-08-28 LAB — CBC WITH DIFFERENTIAL/PLATELET
EOS%: 1.6 % (ref 0.0–7.0)
MCH: 29 pg (ref 25.1–34.0)
MCHC: 33.1 g/dL (ref 31.5–36.0)
MCV: 87.6 fL (ref 79.5–101.0)
MONO%: 5.4 % (ref 0.0–14.0)
RBC: 4.03 10*6/uL (ref 3.70–5.45)
RDW: 16.3 % — ABNORMAL HIGH (ref 11.2–14.5)

## 2011-08-28 NOTE — Progress Notes (Signed)
ID: Caron Presume   Interval History: Ms. 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 We had been following her with weekly lab work after completing her chemotherapy 07/09/2011. We have been following her beta hCG weekly. On October 24 it was 0.6 on October 31 of 0.9 however on October 8 it was 33.5. That result was reviewed and proved to be an error. We have continue to follow her labs weekly and the beta hCG is currently unmeasurable.  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 When she walks fast or uphill she feels a little short of breath. There has been no cough phlegm production pleurisy should or hemoptysis he has she has had no fevers no unusual pains no cramps and ensured a detailed review of systems was noncontributory. Last menstrual period was last week. It lasted 4 days of which only the first day was heavy. They are not using any form of contraception but also they are not having intercourse. This is discussed further below.   Medications: I have reviewed the patient's current medications. I have asked her to stop her potassium supplementation at this point.   Current Outpatient Prescriptions  Medication Sig Dispense Refill  . potassium bicarbonate (K-LYTE) 25 MEQ disintegrating tablet Take 25 mEq by mouth 2 (two) times daily.           Objective: Vital signs in last 24 hours: BP 124/80  Pulse 105  Temp 98 F (36.7 C)  Wt 110 lb 14.4 oz (50.304 kg)   Physical Exam:    Sclerae unicteric  Oropharynx clear  No peripheral adenopathy  Lungs clear -- no rales or rhonchi  Heart regular rate and rhythm  Abdomen benign, no masses or distension  MSK no focal spinal tenderness, no peripheral edema  Port intact    Lab Results: The beta hCG remains less than 0.5.   CMP  Lab Results  Component Value Date   GLUCOSE 132* 08/22/2011   GLUCOSE 132* 08/22/2011   ALT 26 08/22/2011   ALT 26 08/22/2011   AST 26 08/22/2011   AST 26  08/22/2011   NA 137 08/22/2011   NA 137 08/22/2011   K 3.5 08/22/2011   K 3.5 08/22/2011   CL 105 08/22/2011   CL 105 08/22/2011   CREATININE 0.70 08/22/2011   CREATININE 0.70 08/22/2011   BUN 15 08/22/2011   BUN 15 08/22/2011   CO2 20 08/22/2011   CO2 20 08/22/2011     Lab Results  Component Value Date   WBC 5.7 08/28/2011   HGB 11.7 08/28/2011   HCT 35.3 08/28/2011   MCV 87.6 08/28/2011   PLT 178 08/28/2011        Studies/Results: No results found.  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  Other problems include a history of hypokalemia, anemia, and significant cytopenias towards the latter courses of chemotherapy requiring treatment delays.    Plan: As noted above we are going off the potassium so she will be on no medications. I have encouraged her to take regular walks and get herself back into shape to see if the mild shortness of breath she experiences sometimes I can resolve. I  have asked that if they do have intercourse to use condoms and spermicidal jelly and I have written that up for her. We discussed getting rid of the port. But we are going to do is continue to follow her for the next 3 months and then repeat a chest x-ray. If that remains negative we will remove the port at that point  We are now going to check her labs every 2 weeks until she sees me again late February. She knows to call for any problems that may develop before the next visit MAGRINAT,GUSTAV C 08/28/2011

## 2011-08-28 NOTE — Telephone Encounter (Signed)
called pt and lmovm with appts for [phone]

## 2011-08-28 NOTE — Telephone Encounter (Signed)
pt called and confirm appt for [id number] °

## 2011-09-01 LAB — BETA HCG QUANT (REF LAB): Beta hCG, Tumor Marker: <0.5 m[IU]/mL (ref <5.0)

## 2011-09-04 ENCOUNTER — Ambulatory Visit: Payer: PRIVATE HEALTH INSURANCE | Admitting: Oncology

## 2011-11-17 ENCOUNTER — Ambulatory Visit (HOSPITAL_COMMUNITY)
Admission: RE | Admit: 2011-11-17 | Discharge: 2011-11-17 | Disposition: A | Discharge status: Home / Self Care | Payer: PRIVATE HEALTH INSURANCE | Source: Ambulatory Visit | Attending: Oncology | Admitting: Oncology

## 2011-11-17 ENCOUNTER — Ambulatory Visit (HOSPITAL_BASED_OUTPATIENT_CLINIC_OR_DEPARTMENT_OTHER): Payer: PRIVATE HEALTH INSURANCE | Admitting: Oncology

## 2011-11-17 ENCOUNTER — Telehealth: Payer: Self-pay | Admitting: *Deleted

## 2011-11-17 ENCOUNTER — Other Ambulatory Visit: Payer: PRIVATE HEALTH INSURANCE | Admitting: Lab

## 2011-11-17 VITALS — BP 118/81 | HR 97 | Temp 97.6°F | Ht 58.5 in | Wt 116.3 lb

## 2011-11-17 DIAGNOSIS — C801 Malignant (primary) neoplasm, unspecified: Secondary | ICD-10-CM

## 2011-11-17 DIAGNOSIS — O020 Blighted ovum and nonhydatidiform mole: Secondary | ICD-10-CM

## 2011-11-17 DIAGNOSIS — C7800 Secondary malignant neoplasm of unspecified lung: Secondary | ICD-10-CM | POA: Insufficient documentation

## 2011-11-17 LAB — CBC WITH DIFFERENTIAL/PLATELET
Basophils Absolute: 0 10*3/uL (ref 0.0–0.1)
Eosinophils Absolute: 0.2 10*3/uL (ref 0.0–0.5)
HGB: 12.4 g/dL (ref 11.6–15.9)
NEUT#: 5 10*3/uL (ref 1.5–6.5)
RBC: 4.46 10*6/uL (ref 3.70–5.45)
RDW: 12.3 % (ref 11.2–14.5)
WBC: 7.1 10*3/uL (ref 3.9–10.3)
lymph#: 1.4 10*3/uL (ref 0.9–3.3)

## 2011-11-17 LAB — BASIC METABOLIC PANEL
BUN: 10 mg/dL (ref 6–23)
Calcium: 8.8 mg/dL (ref 8.4–10.5)
Creatinine, Ser: 0.66 mg/dL (ref 0.50–1.10)

## 2011-11-17 LAB — HCG, QUANTITATIVE, PREGNANCY: hCG, Beta Chain, Quant, S: <2 m[IU]/mL

## 2011-11-17 NOTE — Telephone Encounter (Signed)
gave patient appointment for 02-17-2012 sent patient over to the radiology to get chest x-ray done

## 2011-11-17 NOTE — Progress Notes (Signed)
ID: [name]   DOB: 27-Feb-1981  MR#: [id number]  CSN#:[id number]  HISTORY OF PRESENT ILLNESS: the patient was followed at [facility] and found to have a molar pregnancy.  On 01/23/2011, she had a 1st trimester dilatation and evacuation with the pathology ([id number]) showing an 11-week molar pregnancy.  The preoperative beta-HCG was 261,077.  Repeat 2 weeks postop was down to 88,955, however, repeat May 23rd was 191,584 and on May 26th 236,711.  At that point, the patient was referred here for further evaluation and treatment.  INTERVAL HISTORY: she is "back to normal", her fatigue is gone, she is working full-time at [employer]. Family is "okay".  REVIEW OF SYSTEMS: her periods have resumed. They are normal in  duration and frequency. They are using mechanical contraception. About 2 weeks ago she started having a little bit of a cough, which is not productive, not accompanied by fever, pleurisy, or worsening shortness of breath. She does experience shortness of breath when she walks fast or walks up stairs or up a slow. A detailed review of systems was otherwise unremarkable  PAST MEDICAL HISTORY: Past Medical History  Diagnosis Date  . Hydatidiform mole     hydatidiform dx 5/12    PAST SURGICAL HISTORY: Remote cleft palate repair  FAMILY HISTORY  The patient's father is alive at age 64.  The patient's mother is alive at age 59.  The patient has 1 brother, no sisters.  GYNECOLOGIC HISTORY: GX P0. Her periods are regular; she is using contraception  SOCIAL HISTORY: The patient is an immigrant from [place] and currently works at [employer] Monday through Saturday.  She usually starts work between 10 and 11 in the morning.  Her husband whose name is Chamroeun Caryl Ada is present today.  He also has immigrated from [place] and has a job Conservation officer, historic buildings."  At home, in addition to the patient and her husband, are her parents, her brother, and her brother's wife and child.   ADVANCED  DIRECTIVES:  HEALTH MAINTENANCE: History  Substance Use Topics  . Smoking status: Not on file  . Smokeless tobacco: Not on file  . Alcohol Use: Not on file     Colonoscopy:  PAP:  Bone density:  Lipid panel:  No Known Allergies  Current Outpatient Prescriptions  Medication Sig Dispense Refill  . potassium bicarbonate (K-LYTE) 25 MEQ disintegrating tablet Take 25 mEq by mouth 2 (two) times daily.          OBJECTIVE: young [place] woman who appears well Filed Vitals:   11/17/11 0907  BP: 118/81  Pulse: 97  Temp: 97.6 F (36.4 [name])     Body mass index is 23.89 kg/(m^2).    ECOG FS:0  Sclerae unicteric Oropharynx clear No peripheral adenopathy Lungs no rales or rhonchi Heart regular rate and rhythm Abd benign MSK no focal spinal tenderness, no peripheral edema Neuro: nonfocal  LAB RESULTS: most recent B-HCG < 0.5 (December 2012)   Lab Results  Component Value Date   WBC 7.1 11/17/2011   NEUTROABS 5.0 11/17/2011   HGB 12.4 11/17/2011   HCT 37.8 11/17/2011   MCV 84.7 11/17/2011   PLT 233 11/17/2011      Chemistry      Component Value Date/Time   NA 137 08/28/2011 0816   K 3.2* 08/28/2011 0816   CL 102 08/28/2011 0816   CO2 25 08/28/2011 0816   BUN 11 08/28/2011 0816   CREATININE 0.71 08/28/2011 0816      Component Value Date/Time  CALCIUM 9.0 08/28/2011 0816   ALKPHOS 75 08/28/2011 0816   AST 28 08/28/2011 0816   ALT 33 08/28/2011 0816   BILITOT 0.4 08/28/2011 0816       STUDIES: No results found.  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   PLAN: we're going to obtain a chest x-ray just to make sure there has been no change in her lungs  (last films were October 2012). Otherwise am going to start taking her labs every 3 months. She will see me again in May. She knows to call for any problems that may develop before the next visit.   [name]    11/17/2011

## 2012-02-17 ENCOUNTER — Telehealth: Payer: Self-pay | Admitting: *Deleted

## 2012-02-17 ENCOUNTER — Other Ambulatory Visit (HOSPITAL_BASED_OUTPATIENT_CLINIC_OR_DEPARTMENT_OTHER): Payer: PRIVATE HEALTH INSURANCE | Admitting: Lab

## 2012-02-17 ENCOUNTER — Ambulatory Visit (HOSPITAL_BASED_OUTPATIENT_CLINIC_OR_DEPARTMENT_OTHER): Payer: PRIVATE HEALTH INSURANCE | Admitting: Oncology

## 2012-02-17 VITALS — BP 124/78 | HR 94 | Temp 97.9°F | Ht 58.5 in | Wt 124.8 lb

## 2012-02-17 DIAGNOSIS — C801 Malignant (primary) neoplasm, unspecified: Secondary | ICD-10-CM

## 2012-02-17 DIAGNOSIS — D392 Neoplasm of uncertain behavior of placenta: Secondary | ICD-10-CM

## 2012-02-17 DIAGNOSIS — O020 Blighted ovum and nonhydatidiform mole: Secondary | ICD-10-CM

## 2012-02-17 DIAGNOSIS — C7800 Secondary malignant neoplasm of unspecified lung: Secondary | ICD-10-CM

## 2012-02-17 LAB — COMPREHENSIVE METABOLIC PANEL
Albumin: 4.1 g/dL (ref 3.5–5.2)
Alkaline Phosphatase: 65 U/L (ref 39–117)
CO2: 24 mEq/L (ref 19–32)
Glucose, Bld: 119 mg/dL — ABNORMAL HIGH (ref 70–99)
Potassium: 3.7 mEq/L (ref 3.5–5.3)
Sodium: 137 mEq/L (ref 135–145)
Total Protein: 6.5 g/dL (ref 6.0–8.3)

## 2012-02-17 LAB — CBC WITH DIFFERENTIAL/PLATELET
HCT: 38.1 % (ref 34.8–46.6)
LYMPH%: 17.4 % (ref 14.0–49.7)
MCH: 27.4 pg (ref 25.1–34.0)
MCHC: 32.9 g/dL (ref 31.5–36.0)
MCV: 83.4 fL (ref 79.5–101.0)
NEUT%: 75.8 % (ref 38.4–76.8)
Platelets: 222 10*3/uL (ref 145–400)
RBC: 4.57 10*6/uL (ref 3.70–5.45)
WBC: 9.6 10*3/uL (ref 3.9–10.3)

## 2012-02-17 LAB — HCG, SERUM, QUALITATIVE: Preg, Serum: NEGATIVE

## 2012-02-17 NOTE — Progress Notes (Signed)
ID: [name]   DOB: 04/27/1981  MR#: [id number]  CSN#:[id number]  HISTORY OF PRESENT ILLNESS: the patient was followed at [facility] and found to have a molar pregnancy.  On 01/23/2011, she had a 1st trimester dilatation and evacuation with the pathology ([id number]) showing an 11-week molar pregnancy.  The preoperative beta-HCG was 261,077.  Repeat 2 weeks postop was down to 88,955, however, repeat May 23rd was 191,584 and on May 26th 236,711.  At that point, the patient was referred here for further evaluation and treatment.  INTERVAL HISTORY: The patient continues to do well. She works full-time at [employer]. She is planning a beach trip for the July 4 holiday.  REVIEW OF SYSTEMS: Her periods are regular, monthly, last 5-7 days, are not particularly heavy. She is not intending to get pregnant at this point. She has no unusual headaches, visual changes, cough, phlegm production, pleurisy, shortness of breath, or change in bowel or bladder habits. A detailed review of systems today was unremarkable.  PAST MEDICAL HISTORY: Past Medical History  Diagnosis Date  . Hydatidiform mole     hydatidiform dx 5/12    PAST SURGICAL HISTORY: Remote cleft palate repair  FAMILY HISTORY  The patient's father is alive at age 32.  The patient's mother is alive at age 21.  The patient has 1 brother, no sisters.  GYNECOLOGIC HISTORY: GX P0. Her periods are regular; she is using contraception  SOCIAL HISTORY: The patient is an immigrant from [place] and currently works at [employer] Monday through Saturday.  She usually starts work between 10 and 11 in the morning.  Her husband whose name is [name], Jillian (goes by [name]) also has immigrated from [place] and has a job [name], historic buildings."  At home, in addition to the patient and her husband, are her parents, her brother, and her brother's wife and child.   ADVANCED DIRECTIVES:  HEALTH MAINTENANCE: History  Substance Use Topics  . Smoking status:  Not on file  . Smokeless tobacco: Not on file  . Alcohol Use: Not on file     Colonoscopy:  PAP:  Bone density:  Lipid panel:  No Known Allergies  Current Outpatient Prescriptions  Medication Sig Dispense Refill  . potassium bicarbonate (K-LYTE) 25 MEQ disintegrating tablet Take 25 mEq by mouth 2 (two) times daily.          OBJECTIVE: young [place] woman who appears well Filed Vitals:   02/17/12 1054  BP: 124/78  Pulse: 94  Temp: 97.9 F (36.6 [name])     Body mass index is 25.64 kg/(m^2).    ECOG FS:0  Sclerae unicteric Oropharynx clear No peripheral adenopathy Lungs no rales or rhonchi Heart regular rate and rhythm Abd benign MSK no focal spinal tenderness, no peripheral edema Neuro: nonfocal  LAB RESULTS: most recent B-HCG <2 (FEB 2013--repeated today  Lab Results  Component Value Date   WBC 9.6 02/17/2012   NEUTROABS 7.3* 02/17/2012   HGB 12.6 02/17/2012   HCT 38.1 02/17/2012   MCV 83.4 02/17/2012   PLT 222 02/17/2012      Chemistry      Component Value Date/Time   NA 140 11/17/2011 0858   K 3.4* 11/17/2011 0858   CL 105 11/17/2011 0858   CO2 26 11/17/2011 0858   BUN 10 11/17/2011 0858   CREATININE 0.66 11/17/2011 0858      Component Value Date/Time   CALCIUM 8.8 11/17/2011 0858   ALKPHOS 75 08/28/2011 0816   AST 28 08/28/2011  0816   ALT 33 08/28/2011 0816   BILITOT 0.4 08/28/2011 0816       STUDIES:   CHEST - 2 VIEW  Comparison: 07/20/2011  Findings: Heart size appears normal.  No pleural effusion or pulmonary edema identified.  No airspace consolidation identified.  Review of the visualized osseous structures is unremarkable.  IMPRESSION:  1. No active cardiopulmonary abnormalities.  Original Report Authenticated By: Rosealee Albee, M.D.   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   PLAN: She is doing well, with no evidence of disease recurrence. She will see [place] again in 3 months, with repeat lab work before that visit. The overall plan is for her 2 year followup from tumor marker normalization. She knows to call for any problems that may develop before the next visit.   [name]    02/17/2012

## 2012-02-17 NOTE — Telephone Encounter (Signed)
gave patient appointment for 05-19-2012 lab only 05-26-2012 amy berry at 9:15am printed out calendar and gave to the patient

## 2012-03-27 IMAGING — US IR FLUORO GUIDE CV LINE*R*
1 series · 1 of 1 positions shown · non-contrast
Comparison: none

Clinical Data/Indication: Molar pregnancy

TUNNEL POWER PORT PLACEMENT WITH SUBCUTANEOUS POCKET UTILIZING
ULTRASOUND & FLOUROSCOPY
Sedation: Versed 2 mg, Fentanyl 200 mcg.
Total Moderate Sedation Time: 40 minutes.
Additional Medications: Cefazolin 1 gram.
Fluoroscopy Time: 0.7 minutes.
Procedure:  After written informed consent was obtained, patient
was placed in the supine position on angiographic table. The right
neck and chest was prepped and draped in a sterile fashion.
Lidocaine was utilized for local anesthesia.  The right internal
jugular vein was noted to be patent initially with ultrasound.
Under sonographic guidance, a micropuncture needle was inserted
into the right IJ vein (Ultrasound and fluoroscopic image
documentation was performed). The needle was removed over an 018
wire which was exchanged for a Amplatz.  This was advanced into the
IVC.  An 8-French dilator was advanced over the Amplatz.
A small incision was made in the right upper chest over the
anterior right second rib.  Utilizing blunt dissection, a
subcutaneous pocket was created in the caudal direction. The pocket
was irrigated with a copious amount of sterile normal saline.  The
port catheter was tunneled from the chest incision, and out the
neck incision.  The reservoir was inserted into the subcutaneous
pocket and secured with two 3-0 Ethilon stitches.  A peel-away
sheath was advanced over the Amplatz wire.  The port catheter was
cut to measure length and inserted through the peel-away sheath.
The peel-away sheath was removed.  The chest incision was closed
with 3-0 Vicryl interrupted stitches for the subcutaneous tissue
and a running of 4-0 Vicryl subcuticular stitch for the skin.  The
neck incision was closed with a 4-0 Vicryl subcuticular stitch.
Derma-bond was applied to both surgical incisions.  The port
reservoir was flushed and instilled with heparinized saline.  No
complications.

[Series 1: ir fluoro guide cv line*right* · 1 of 1 slices shown]
[im 1/1]
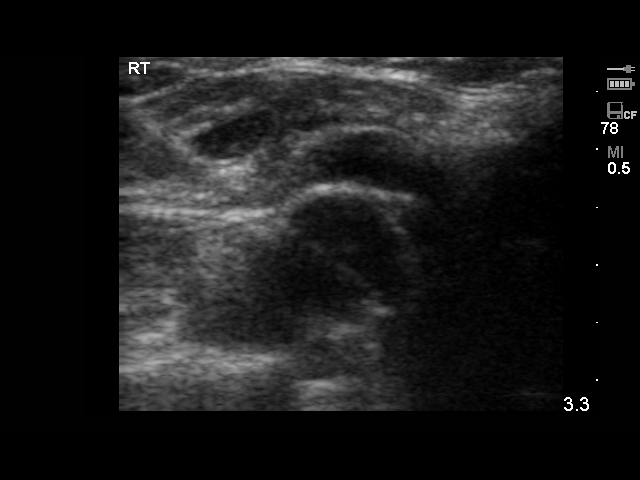

[1 of 1 positions shown; findings below may reference images not displayed]

FINDINGS: A right IJ vein Port-A-Cath is in place with its tip at
the cavoatrial junction.
IMPRESSION: Successful 8 French right internal jugular vein power port
placement with its tip at the SVC/RA junction.

## 2012-05-19 ENCOUNTER — Other Ambulatory Visit (HOSPITAL_BASED_OUTPATIENT_CLINIC_OR_DEPARTMENT_OTHER): Payer: BC Managed Care – PPO | Admitting: Lab

## 2012-05-19 DIAGNOSIS — D392 Neoplasm of uncertain behavior of placenta: Secondary | ICD-10-CM

## 2012-05-19 DIAGNOSIS — O020 Blighted ovum and nonhydatidiform mole: Secondary | ICD-10-CM

## 2012-05-19 DIAGNOSIS — O0289 Other abnormal products of conception: Secondary | ICD-10-CM

## 2012-05-19 LAB — CBC WITH DIFFERENTIAL/PLATELET
BASO%: 0.2 % (ref 0.0–2.0)
EOS%: 1.4 % (ref 0.0–7.0)
LYMPH%: 29.4 % (ref 14.0–49.7)
MCHC: 32.9 g/dL (ref 31.5–36.0)
MONO#: 0.4 10*3/uL (ref 0.1–0.9)
Platelets: 207 10*3/uL (ref 145–400)
RBC: 4.74 10*6/uL (ref 3.70–5.45)
WBC: 5.6 10*3/uL (ref 3.9–10.3)

## 2012-05-19 LAB — COMPREHENSIVE METABOLIC PANEL (CC13)
Albumin: 3.4 g/dL — ABNORMAL LOW (ref 3.5–5.0)
Alkaline Phosphatase: 101 U/L (ref 40–150)
BUN: 9 mg/dL (ref 7.0–26.0)
Glucose: 142 mg/dl — ABNORMAL HIGH (ref 70–99)
Total Bilirubin: 0.5 mg/dL (ref 0.20–1.20)

## 2012-05-26 ENCOUNTER — Ambulatory Visit (HOSPITAL_BASED_OUTPATIENT_CLINIC_OR_DEPARTMENT_OTHER): Payer: BC Managed Care – PPO | Admitting: Physician Assistant

## 2012-05-26 ENCOUNTER — Encounter: Payer: Self-pay | Admitting: Physician Assistant

## 2012-05-26 ENCOUNTER — Telehealth: Payer: Self-pay | Admitting: Oncology

## 2012-05-26 VITALS — BP 127/79 | HR 108 | Temp 97.9°F | Resp 20 | Ht 58.5 in | Wt 121.3 lb

## 2012-05-26 DIAGNOSIS — E876 Hypokalemia: Secondary | ICD-10-CM | POA: Insufficient documentation

## 2012-05-26 DIAGNOSIS — O020 Blighted ovum and nonhydatidiform mole: Secondary | ICD-10-CM

## 2012-05-26 DIAGNOSIS — D392 Neoplasm of uncertain behavior of placenta: Secondary | ICD-10-CM

## 2012-05-26 MED ORDER — POTASSIUM CHLORIDE CRYS ER 10 MEQ PO TBCR: 20.0000 meq | EXTENDED_RELEASE_TABLET | Freq: Two times a day (BID) | ORAL | Status: DC

## 2012-05-26 NOTE — Telephone Encounter (Signed)
lmonvm adviisng the pt of her dec appts. S/w tina from [facility] and she is aware of the orders for the port removal

## 2012-05-26 NOTE — Progress Notes (Signed)
ID: [name]   DOB: 17-Mar-1981  MR#: [id number]  CSN#:[id number]  HISTORY OF PRESENT ILLNESS: the patient was followed at [facility] and found to have a molar pregnancy.  On 01/23/2011, she had a 1st trimester dilatation and evacuation with the pathology ([id number]) showing an 11-week molar pregnancy.  The preoperative beta-HCG was 261,077.  Repeat 2 weeks postop was down to 88,955, however, repeat May 23rd was 191,584 and on May 26th 236,711.  At that point, the patient was referred here for further evaluation and treatment.  INTERVAL HISTORY:  [name] returns today for routine three-month followup of her history of molar pregnancy. She continues to work full-time at [employer].  She tells me it has been a busy summer, but she is doing well. She can 10 use to have menstrual cycles monthly, her most recent in late August. They are "normal", not especially heavy, and last 4-6 days. She has some mild cramping with her period, but no additional complaints. She currently is not trying to get pregnant.  REVIEW OF SYSTEMS:  [name] has been well with no recent illnesses. She denies any fevers or night sweats. No nausea or emesis, and no change in bowel habits. No cough, phlegm production, or shortness of breath, although she did feel little short of breath while hiking last week. She typically has no significant shortness of breath with regular exertion. No chest pain or pressure. No abnormal headaches or dizziness. No abnormal bleeding. No unusual myalgias, arthralgias, or bony pain.  A detailed review of systems is otherwise stable and noncontributory.    PAST MEDICAL HISTORY: Past Medical History  Diagnosis Date  . Hydatidiform mole     hydatidiform dx 5/12    PAST SURGICAL HISTORY: Remote cleft palate repair  FAMILY HISTORY  The patient's father is alive at age 58.  The patient's mother is alive at age 13.  The patient has 1 brother, no sisters.  GYNECOLOGIC HISTORY: GX P0. Her  periods are regular; she is using contraception  SOCIAL HISTORY: The patient is an immigrant from [place] and currently works at [employer] Monday through Saturday.  She usually starts work between 10 and 11 in the morning.  Her husband whose name is Conservation officer, nature (goes by [place]) also has immigrated from [place] and has a job Conservation officer, historic buildings."  At home, in addition to the patient and her husband, are her parents, her brother, and her brother's wife and child.   ADVANCED DIRECTIVES:  HEALTH MAINTENANCE: History  Substance Use Topics  . Smoking status: Never Smoker   . Smokeless tobacco: Not on file  . Alcohol Use: No     Colonoscopy:  PAP:  Bone density:  Lipid panel:  No Known Allergies  Current Outpatient Prescriptions  Medication Sig Dispense Refill  . potassium chloride SA (K-DUR,KLOR-CON) 10 MEQ tablet Take 2 tablets (20 mEq total) by mouth 2 (two) times daily.  60 tablet  3    OBJECTIVE: young [place] woman who appears well Filed Vitals:   05/26/12 0912  BP: 127/79  Pulse: 108  Temp: 97.9 F (36.6 C)  Resp: 20     Body mass index is 24.92 kg/(m^2).    ECOG FS:0 Filed Weights   05/26/12 0912  Weight: 121 lb 4.8 oz (55.021 kg)    Sclerae unicteric Oropharynx clear No peripheral adenopathy Lungs no rales or rhonchi Heart regular rate and rhythm Abd benign, soft and nontender MSK no focal spinal tenderness, no peripheral edema Port is intact in the  right upper chest wall Neuro: nonfocal, alert and oriented x3  LAB RESULTS:  Lab Results  Component Value Date   WBC 5.6 05/19/2012   NEUTROABS 3.5 05/19/2012   HGB 12.4 05/19/2012   HCT 37.6 05/19/2012   MCV 79.2* 05/19/2012   PLT 207 05/19/2012      Chemistry      Component Value Date/Time   NA 139 05/19/2012 0802   NA 137 02/17/2012 1030   K 3.3* 05/19/2012 0802   K 3.7 02/17/2012 1030   CL 109* 05/19/2012 0802   CL 106 02/17/2012 1030   CO2 20* 05/19/2012 0802   CO2 24 02/17/2012 1030   BUN 9.0 05/19/2012  0802   BUN 10 02/17/2012 1030   CREATININE 0.7 05/19/2012 0802   CREATININE 0.87 02/17/2012 1030      Component Value Date/Time   CALCIUM 8.6 02/17/2012 1030   ALKPHOS 101 05/19/2012 0802   ALKPHOS 65 02/17/2012 1030   AST 23 05/19/2012 0802   AST 16 02/17/2012 1030   ALT 32 05/19/2012 0802   ALT 17 02/17/2012 1030   BILITOT 0.50 05/19/2012 0802   BILITOT 0.3 02/17/2012 1030     Serum quantitative hCG  Was normal at <2 on 8/28/213.  STUDIES:   No results found.   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   PLAN:  Ariani continues to do well, with no evidence of disease recurrence at this time. We will continue to see her every 3 months, and she will return to see [place] in December.  The overall plan is for her 2 year followup from tumor marker normalization.   In the meanwhile I have written her another prescription for Klor-Con, 10 mEq twice daily, for hypokalemia.   she would also like to have her port removed, and we have referred her back to interventional radiology accordingly. She knows to call for any problems that may develop before the next visit.   Meloni Hinz    05/26/2012

## 2012-05-30 ENCOUNTER — Other Ambulatory Visit: Payer: Self-pay | Admitting: Oncology

## 2012-06-01 ENCOUNTER — Encounter (HOSPITAL_COMMUNITY): Payer: Self-pay | Admitting: Pharmacy Technician

## 2012-06-03 ENCOUNTER — Other Ambulatory Visit: Payer: Self-pay | Admitting: Radiology

## 2012-06-07 ENCOUNTER — Encounter (HOSPITAL_COMMUNITY): Payer: Self-pay

## 2012-06-07 ENCOUNTER — Ambulatory Visit (HOSPITAL_COMMUNITY)
Admission: RE | Admit: 2012-06-07 | Discharge: 2012-06-07 | Disposition: A | Discharge status: Home / Self Care | Payer: BC Managed Care – PPO | Source: Ambulatory Visit | Attending: Physician Assistant | Admitting: Physician Assistant

## 2012-06-07 DIAGNOSIS — Z452 Encounter for adjustment and management of vascular access device: Secondary | ICD-10-CM | POA: Insufficient documentation

## 2012-06-07 DIAGNOSIS — O020 Blighted ovum and nonhydatidiform mole: Secondary | ICD-10-CM

## 2012-06-07 DIAGNOSIS — O0289 Other abnormal products of conception: Secondary | ICD-10-CM | POA: Insufficient documentation

## 2012-06-07 HISTORY — PX: PORT-A-CATH REMOVAL: SHX5289

## 2012-06-07 LAB — CBC
MCHC: 33.2 g/dL (ref 30.0–36.0)
Platelets: 254 10*3/uL (ref 150–400)
RDW: 12.9 % (ref 11.5–15.5)

## 2012-06-07 LAB — PROTIME-INR
INR: 0.98 (ref 0.00–1.49)
Prothrombin Time: 13.2 seconds (ref 11.6–15.2)

## 2012-06-07 LAB — APTT: aPTT: 35 seconds (ref 24–37)

## 2012-06-07 MED ORDER — FENTANYL CITRATE 0.05 MG/ML IJ SOLN
INTRAMUSCULAR | Status: AC
  Filled 2012-06-07: qty 2

## 2012-06-07 MED ORDER — ONDANSETRON HCL 4 MG/2ML IJ SOLN
4.0000 mg | Freq: Once | INTRAMUSCULAR | Status: AC
  Administered 2012-06-07: 4 mg via INTRAVENOUS

## 2012-06-07 MED ORDER — CEFAZOLIN SODIUM 1-5 GM-% IV SOLN
1.0000 g | INTRAVENOUS | Status: AC
  Administered 2012-06-07: 1 g via INTRAVENOUS
  Filled 2012-06-07: qty 50

## 2012-06-07 MED ORDER — MIDAZOLAM HCL 2 MG/2ML IJ SOLN
INTRAMUSCULAR | Status: AC
  Filled 2012-06-07: qty 2

## 2012-06-07 MED ORDER — ONDANSETRON HCL 4 MG/2ML IJ SOLN
INTRAMUSCULAR | Status: AC
  Filled 2012-06-07: qty 2

## 2012-06-07 MED ORDER — FENTANYL CITRATE 0.05 MG/ML IJ SOLN
INTRAMUSCULAR | Status: AC | PRN
  Administered 2012-06-07: 100 ug via INTRAVENOUS

## 2012-06-07 MED ORDER — MIDAZOLAM HCL 5 MG/5ML IJ SOLN
INTRAMUSCULAR | Status: AC | PRN
  Administered 2012-06-07: 2 mg via INTRAVENOUS

## 2012-06-07 MED ORDER — SODIUM CHLORIDE 0.9 % IV SOLN: INTRAVENOUS | Status: DC

## 2012-06-07 NOTE — Procedures (Signed)
Successful removal of right anterior chest wall port-a-cath. No immediate post procedural complications.  

## 2012-06-07 NOTE — H&P (Signed)
[name] is an 31 y.o. female.   Chief Complaint: molar pregnancy finished with chemotherapy. Presents today for port a cath removal.  HPI: see oncology note below :  Jillian Gravel, PA Physician Assistant Signed  Progress Notes 05/26/2012 1:27 PM  Related encounter: Office Visit from 05/26/2012 in [facility]  ID: [name]   DOB: 1980-11-01  MR#: [id number]  CSN#:[id number]  HISTORY OF PRESENT ILLNESS: the patient was followed at [facility] and found to have a molar pregnancy.  On 01/23/2011, she had a 1st trimester dilatation and evacuation with the pathology ([id number]) showing an 11-week molar pregnancy.  The preoperative beta-HCG was 261,077.  Repeat 2 weeks postop was down to 88,955, however, repeat May 23rd was 191,584 and on May 26th 236,711.  At that point, the patient was referred here for further evaluation and treatment.  INTERVAL HISTORY:  [name] returns today for routine three-month followup of her history of molar pregnancy. She continues to work full-time at [employer].  She tells me it has been a busy summer, but she is doing well. She can 10 use to have menstrual cycles monthly, her most recent in late August. They are "normal", not especially heavy, and last 4-6 days. She has some mild cramping with her period, but no additional complaints. She currently is not trying to get pregnant.  REVIEW OF SYSTEMS:   [name] has been well with no recent illnesses. She denies any fevers or night sweats. No nausea or emesis, and no change in bowel habits. No cough, phlegm production, or shortness of breath, although she did feel little short of breath while hiking last week. She typically has no significant shortness of breath with regular exertion. No chest pain or pressure. No abnormal headaches or dizziness. No abnormal bleeding. No unusual myalgias, arthralgias, or bony pain.  A detailed review of systems is otherwise stable and noncontributory.    PAST  MEDICAL HISTORY: Past Medical History   Diagnosis  Date   .  Hydatidiform mole         hydatidiform dx 5/12     PAST SURGICAL HISTORY: Remote cleft palate repair  FAMILY HISTORY  The patient's father is alive at age 33.  The patient's mother is alive at age 81.  The patient has 1 brother, no sisters.  GYNECOLOGIC HISTORY: GX P0. Her periods are regular; she is using contraception  SOCIAL HISTORY: The patient is an immigrant from [place] and currently works at [employer] Monday through Saturday.  She usually starts work between 10 and 11 in the morning.  Her husband whose name is Conservation officer, nature (goes by [place]) also has immigrated from [place] and has a job Conservation officer, historic buildings."  At home, in addition to the patient and her husband, are her parents, her brother, and her brother's wife and child.                   ADVANCED DIRECTIVES:  HEALTH MAINTENANCE: History   Substance Use Topics   .  Smoking status:  Never Smoker    .  Smokeless tobacco:  Not on file   .  Alcohol Use:  No                 Colonoscopy:             PAP:             Bone density:  Lipid panel:  No Known Allergies    Current Outpatient Prescriptions   Medication  Sig  Dispense  Refill   .  potassium chloride SA (K-DUR,KLOR-CON) 10 MEQ tablet  Take 2 tablets (20 mEq total) by mouth 2 (two) times daily.   60 tablet   3     OBJECTIVE: young [place] woman who appears well Filed Vitals:     05/26/12 0912   BP:  127/79   Pulse:  108   Temp:  97.9 F (36.6 C)   Resp:  20      Body mass index is 24.92 kg/(m^2).    ECOG FS:0 Filed Weights     05/26/12 0912   Weight:  121 lb 4.8 oz (55.021 kg)     Sclerae unicteric Oropharynx clear No peripheral adenopathy Lungs no rales or rhonchi Heart regular rate and rhythm Abd benign, soft and nontender MSK no focal spinal tenderness, no peripheral edema Port is intact in the right upper chest wall Neuro: nonfocal, alert and oriented x3  LAB  RESULTS:    Lab Results   Component  Value  Date     WBC  5.6  05/19/2012     NEUTROABS  3.5  05/19/2012     HGB  12.4  05/19/2012     HCT  37.6  05/19/2012     MCV  79.2*  05/19/2012     PLT  207  05/19/2012         Chemistry        Component  Value  Date/Time     NA  139  05/19/2012 0802     NA  137  02/17/2012 1030     K  3.3*  05/19/2012 0802     K  3.7  02/17/2012 1030     CL  109*  05/19/2012 0802     CL  106  02/17/2012 1030     CO2  20*  05/19/2012 0802     CO2  24  02/17/2012 1030     BUN  9.0  05/19/2012 0802     BUN  10  02/17/2012 1030     CREATININE  0.7  05/19/2012 0802     CREATININE  0.87  02/17/2012 1030        Component  Value  Date/Time     CALCIUM  8.6  02/17/2012 1030     ALKPHOS  101  05/19/2012 0802     ALKPHOS  65  02/17/2012 1030     AST  23  05/19/2012 0802     AST  16  02/17/2012 1030     ALT  32  05/19/2012 0802     ALT  17  02/17/2012 1030     BILITOT  0.50  05/19/2012 0802     BILITOT  0.3  02/17/2012 1030       Serum quantitative hCG  Was normal at <2 on 8/28/213.  STUDIES:    No results found.   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   PLAN:   [name] continues to do well, with no evidence of disease recurrence at this time.  We will continue to see her every 3 months, and she will return to see [place] in December.  The overall plan is for her 2 year followup from tumor marker normalization.   In the meanwhile I have written her another prescription for Klor-Con, 10 mEq twice daily, for hypokalemia.   she would also like to have her port removed, and we have referred her back to interventional radiology accordingly. She  knows to call for any problems that may develop before the next visit.   [name],Jillian    05/26/2012   Today's examination findings :   Past Medical History  Diagnosis Date  . Hydatidiform mole     hydatidiform dx 5/12    No past surgical history on file.  No family history on file. Social History:  reports that she has never smoked. She does not have any smokeless tobacco history on file. She reports that she does not drink alcohol or use illicit drugs.  Allergies: No Known Allergies    Medication List     As of 06/07/2012  1:49 PM    ASK your doctor about these medications         ibuprofen 200 MG tablet   Commonly known as: ADVIL,MOTRIN   Take 400 mg by mouth every 6 (six) hours as needed. Pain      potassium chloride 10 MEQ tablet   Commonly known as: K-DUR,KLOR-CON   Take 2 tablets (20 mEq total) by mouth 2 (two) times daily.            Results for orders placed during the hospital encounter of 06/07/12 (from the past 48 hour(s))  APTT     Status: Normal   Collection Time   06/07/12 12:25 PM      Component Value Range Comment   aPTT 35  24 - 37 seconds   CBC     Status: Abnormal   Collection Time   06/07/12 12:25 PM      Component Value Range Comment   WBC 10.3  4.0 - 10.5 K/uL    RBC 5.16 (*) 3.87 - 5.11 MIL/uL    Hemoglobin 13.2  12.0 - 15.0 g/dL    HCT 40.9  81.1 - 91.4 %    MCV 76.9 (*) 78.0 - 100.0 fL    MCH 25.6 (*) 26.0 - 34.0 pg    MCHC 33.2  30.0 - 36.0 g/dL    RDW 78.2  95.6 - 21.3 %    Platelets 254  150 - 400 K/uL   PROTIME-INR     Status: Normal   Collection Time   06/07/12 12:25 PM      Component Value Range Comment   Prothrombin Time 13.2  11.6 - 15.2 seconds    INR 0.98  0.00 - 1.49     Review of Systems  Constitutional: Negative for fever, chills and malaise/fatigue.  Respiratory: Negative for cough, hemoptysis, sputum production and shortness of breath.   Cardiovascular: Negative for chest pain, claudication and leg swelling.   Gastrointestinal: Negative for heartburn, nausea and abdominal pain.  Genitourinary: Negative.   Musculoskeletal: Negative.   Neurological: Negative.  Negative for weakness.  Psychiatric/Behavioral: Negative.     Blood pressure 119/79, pulse 92, temperature 98.7 F (37.1 C), SpO2 100.00%. Physical Exam  Constitutional: She is oriented to person, place, and time. She appears well-developed and well-nourished. No distress.  HENT:  Head: Normocephalic and atraumatic.  Cardiovascular: Normal rate, regular rhythm, normal heart sounds and intact distal pulses.  Exam reveals no gallop and no friction rub.   No murmur heard. Respiratory: Effort normal and breath sounds normal. No respiratory distress. She has no wheezes. She has no rales.  GI: Soft. Bowel sounds are normal.  Musculoskeletal: Normal range of motion. She exhibits no edema.  Neurological: She is alert and oriented to person, place, and time.  Skin: Skin is warm and dry.  Psychiatric: She has a normal mood and affect. Her behavior is normal. Judgment and thought content normal.     Assessment/Plan Port a cath removal procedure discussed in detail with patient. Potential complications including but not limited to infection, bleeding, vessel damage, pneumothorax and complications with moderate sedation reviewed with her apparent understanding. Written consent obtained.   CAMPBELL,PAMELA D 06/07/2012, 1:42 PM

## 2012-07-04 IMAGING — CR DG CHEST 2V
2 series · 2 of 2 positions shown · non-contrast
Comparison: 04/17/2011

CLINICAL DATA: Bilateral lung metastases.

CHEST - 2 VIEW

[w chest pa *]
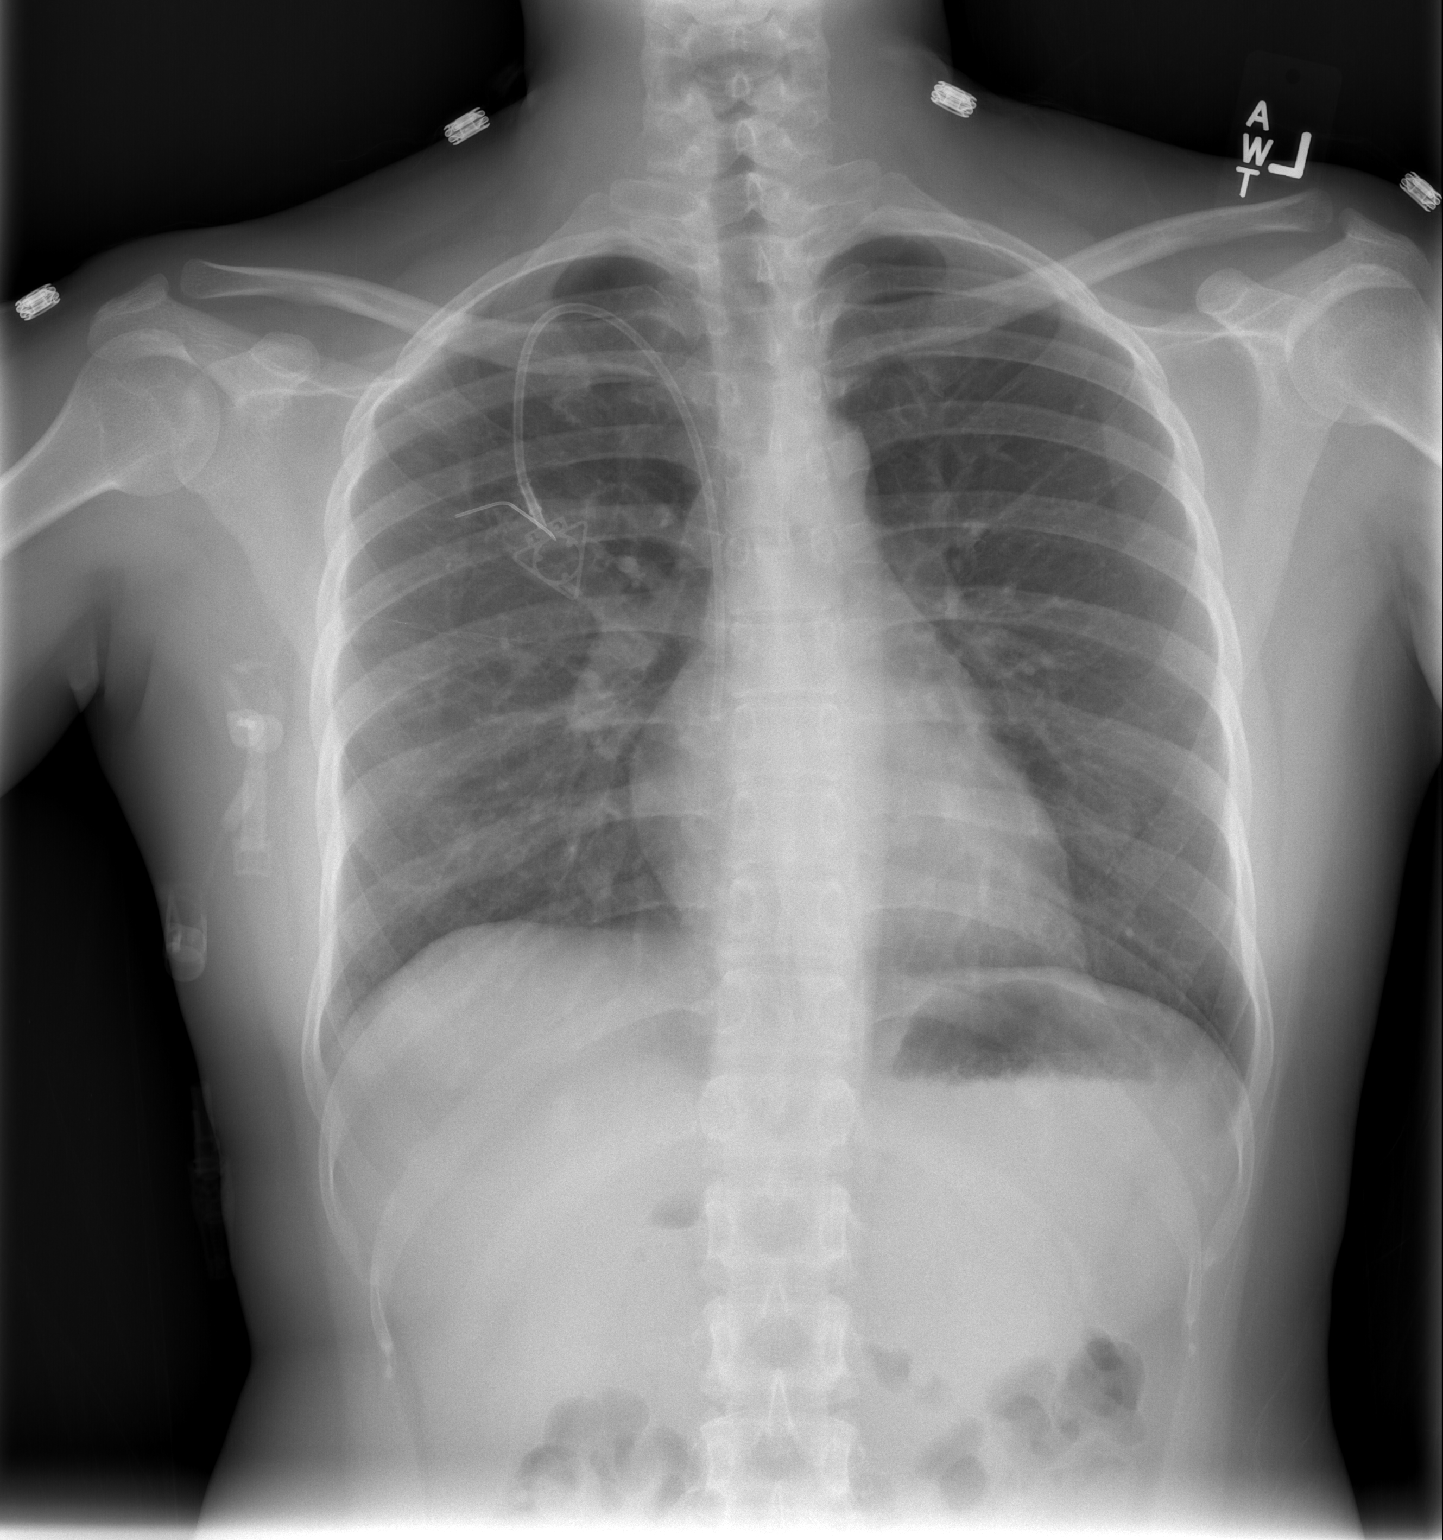

[w chest lat]
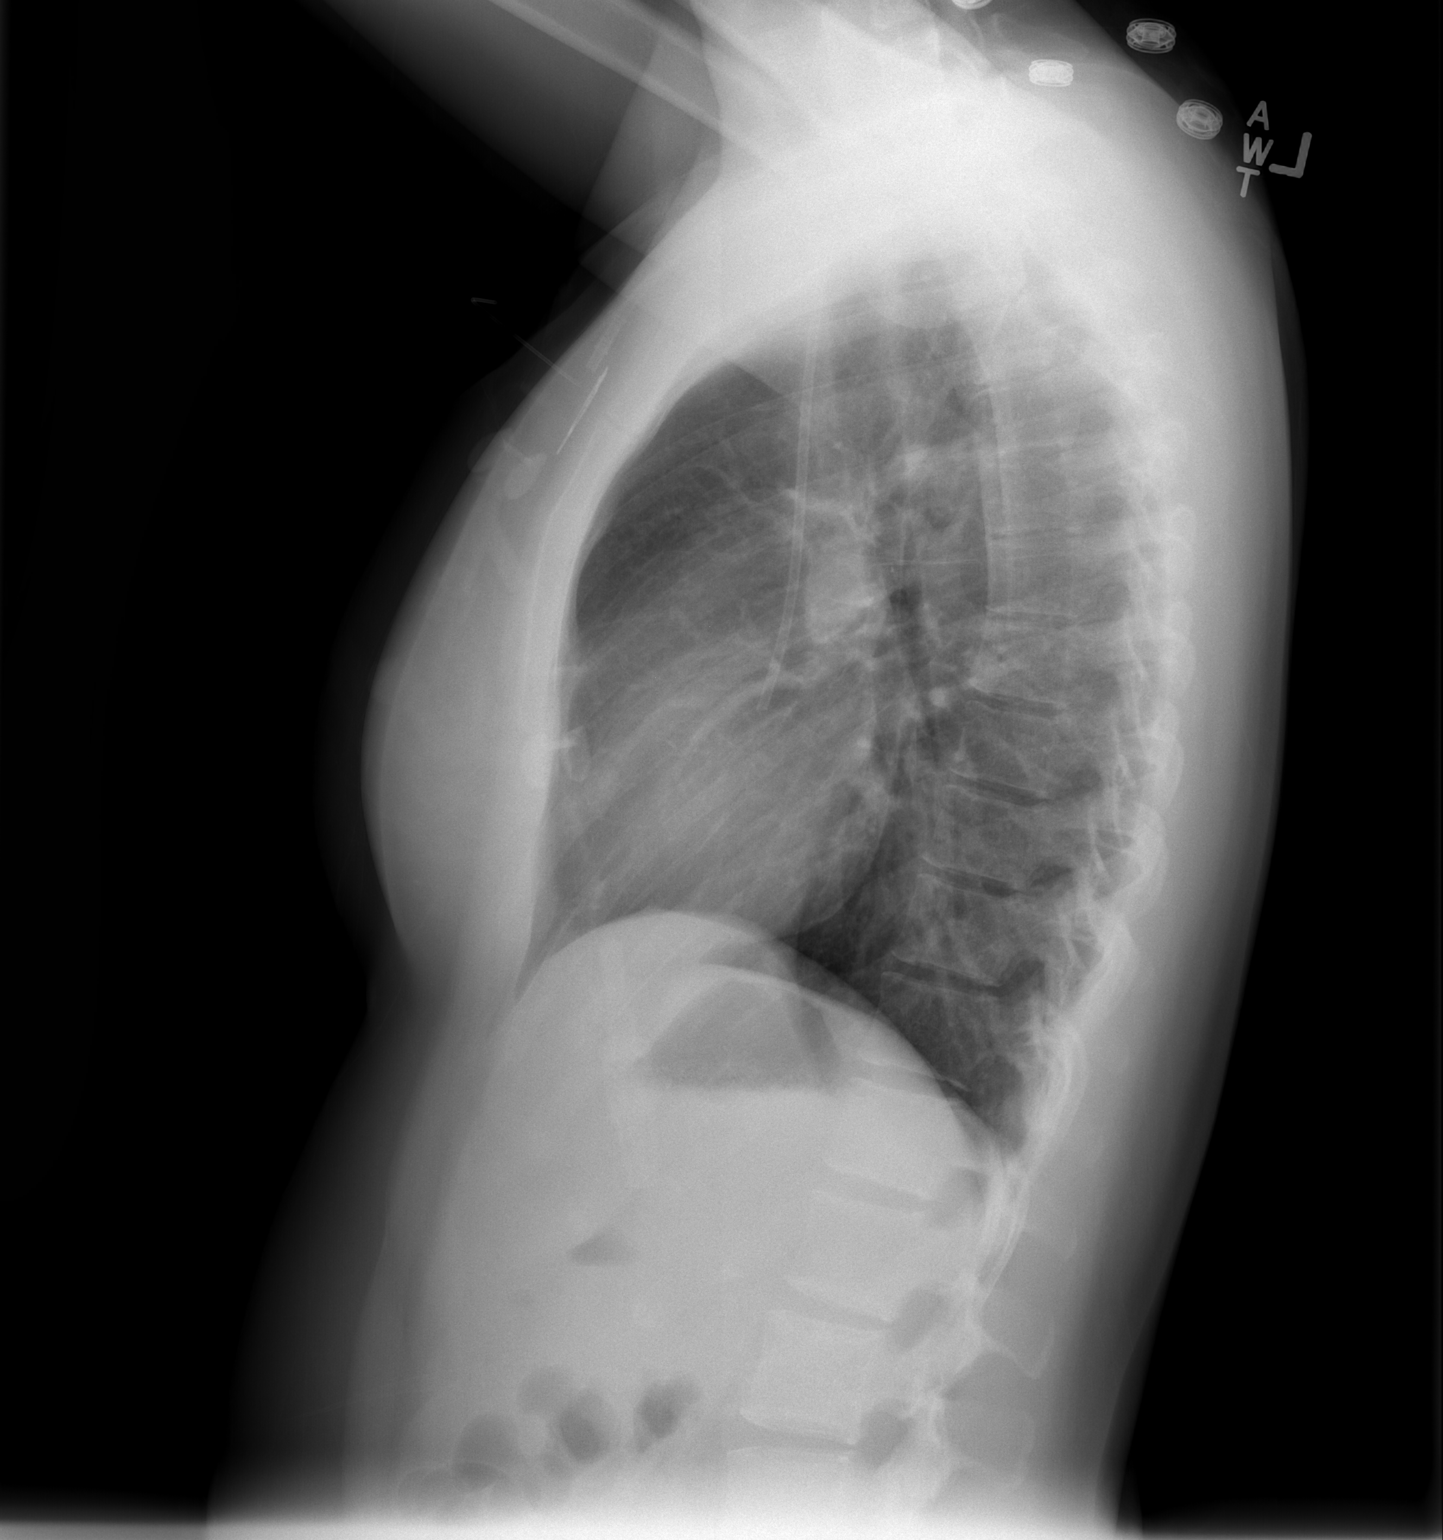

[2 of 2 positions shown; findings below may reference images not displayed]

FINDINGS: Multiple bilateral pulmonary nodules seen on the previous
exam has decreased in the interval.  No new or progressive nodules
are evident. The cardiopericardial silhouette is within normal
limits for size.  Right Port-A-Cath tip projects at the junction of
the SVC and RA.
IMPRESSION: Interval decrease in bilateral pulmonary nodules.

## 2012-08-24 ENCOUNTER — Other Ambulatory Visit (HOSPITAL_BASED_OUTPATIENT_CLINIC_OR_DEPARTMENT_OTHER): Payer: BC Managed Care – PPO | Admitting: Lab

## 2012-08-24 DIAGNOSIS — O0289 Other abnormal products of conception: Secondary | ICD-10-CM

## 2012-08-24 DIAGNOSIS — D392 Neoplasm of uncertain behavior of placenta: Secondary | ICD-10-CM

## 2012-08-24 DIAGNOSIS — O020 Blighted ovum and nonhydatidiform mole: Secondary | ICD-10-CM

## 2012-08-24 LAB — CBC WITH DIFFERENTIAL/PLATELET
BASO%: 0.3 % (ref 0.0–2.0)
EOS%: 1 % (ref 0.0–7.0)
MCH: 26.5 pg (ref 25.1–34.0)
MCHC: 33.4 g/dL (ref 31.5–36.0)
NEUT%: 69.3 % (ref 38.4–76.8)
RDW: 13 % (ref 11.2–14.5)
lymph#: 2 10*3/uL (ref 0.9–3.3)

## 2012-08-24 LAB — COMPREHENSIVE METABOLIC PANEL (CC13)
ALT: 46 U/L (ref 0–55)
AST: 29 U/L (ref 5–34)
Calcium: 9.4 mg/dL (ref 8.4–10.4)
Chloride: 112 mEq/L — ABNORMAL HIGH (ref 98–107)
Creatinine: 0.5 mg/dL — ABNORMAL LOW (ref 0.6–1.1)

## 2012-08-31 ENCOUNTER — Encounter: Payer: Self-pay | Admitting: Oncology

## 2012-08-31 ENCOUNTER — Telehealth: Payer: Self-pay | Admitting: Oncology

## 2012-08-31 ENCOUNTER — Ambulatory Visit: Payer: BC Managed Care – PPO | Admitting: Oncology

## 2012-08-31 ENCOUNTER — Ambulatory Visit (HOSPITAL_BASED_OUTPATIENT_CLINIC_OR_DEPARTMENT_OTHER): Payer: BC Managed Care – PPO | Admitting: Oncology

## 2012-08-31 VITALS — BP 125/81 | HR 128 | Temp 98.5°F | Resp 20 | Ht 58.5 in | Wt 118.0 lb

## 2012-08-31 DIAGNOSIS — O0289 Other abnormal products of conception: Secondary | ICD-10-CM

## 2012-08-31 DIAGNOSIS — E876 Hypokalemia: Secondary | ICD-10-CM

## 2012-08-31 DIAGNOSIS — O020 Blighted ovum and nonhydatidiform mole: Secondary | ICD-10-CM

## 2012-08-31 NOTE — Telephone Encounter (Signed)
gve the pt her march 2014 appt calendar °

## 2012-08-31 NOTE — Progress Notes (Signed)
ID: [name]   DOB: 1981/01/21  MR#: [id number]  CSN#:[id number]  HISTORY OF PRESENT ILLNESS: the patient was followed at [facility] and found to have a molar pregnancy.  On 01/23/2011, she had a 1st trimester dilatation and evacuation with the pathology ([id number]) showing an 11-week molar pregnancy.  The preoperative beta-HCG was 261,077.  Repeat 2 weeks postop was down to 88,955, however, repeat May 23rd was 191,584 and on May 26th 236,711.  At that point, the patient was referred here for further evaluation and treatment.  INTERVAL HISTORY:   [name] returns today for routine three-month followup of her history of molar pregnancy. She continues to work full-time at [employer].  She is trying to exercise more regularly by "walking around the neighborhood". Her family is doing well. She and her husband have decided they are ready to have children, and she has not been using contraception now for 2-3 months. She is having regular menstrual cycles, and is also status post pelvic exam with her OB/GYN last week which she tells me was "normal".   REVIEW OF SYSTEMS:  [name] has been well with no recent illnesses. She denies any fevers or night sweats. No nausea or emesis, and no change in bowel habits. She denies abdominal or pelvic pain. No cough, phlegm production, or shortness of breath. No chest pain or pressure. No abnormal headaches or dizziness. No abnormal bleeding. No unusual myalgias, arthralgias, or bony pain.  A detailed review of systems is otherwise stable and noncontributory.    PAST MEDICAL HISTORY: Past Medical History  Diagnosis Date  . Hydatidiform mole     hydatidiform dx 5/12    PAST SURGICAL HISTORY: Remote cleft palate repair  FAMILY HISTORY  The patient's father is alive at age 57.  The patient's mother is alive at age 37.  The patient has 1 brother, no sisters.  GYNECOLOGIC HISTORY: GX P0. Her periods are regular; she is using contraception  SOCIAL  HISTORY: The patient is an immigrant from [place] and currently works at [employer] Monday through Saturday.  She usually starts work between 10 and 11 in the morning.  Her husband whose name is Conservation officer, nature (goes by [place]) also has immigrated from [place] and has a job Conservation officer, historic buildings."  At home, in addition to the patient and her husband, are her parents, her brother, and her brother's wife and child.   ADVANCED DIRECTIVES:  HEALTH MAINTENANCE: History  Substance Use Topics  . Smoking status: Never Smoker   . Smokeless tobacco: Not on file  . Alcohol Use: No     Colonoscopy: Never  PAP: UTD, [facility]  Bone density: Never  Lipid panel:  No Known Allergies  Current Outpatient Prescriptions  Medication Sig Dispense Refill  . Prenatal Vit-Fe Fumarate-FA (MULTIVITAMIN-PRENATAL) 27-0.8 MG TABS Take 1 tablet by mouth daily.        OBJECTIVE: young [place] woman who appears well Filed Vitals:   08/31/12 1553  BP: 125/81  Pulse: 128  Temp: 98.5 F (36.9 C)  Resp: 20     Body mass index is 24.24 kg/(m^2).    ECOG FS:0 Filed Weights   08/31/12 1553  Weight: 118 lb (53.524 kg)    Sclerae unicteric Oropharynx clear No peripheral adenopathy Lungs no rales or rhonchi Heart regular rate and rhythm Abdomen benign, soft and nontender with positive bowel sounds MSK no focal spinal tenderness, no peripheral edema Neuro: nonfocal, alert and oriented x3  LAB RESULTS:  Lab Results  Component  Value Date   WBC 8.6 08/24/2012   NEUTROABS 6.0 08/24/2012   HGB 12.2 08/24/2012   HCT 36.4 08/24/2012   MCV 79.4* 08/24/2012   PLT 215 08/24/2012      Chemistry      Component Value Date/Time   NA 143 08/24/2012 0815   NA 137 02/17/2012 1030   K 3.8 08/24/2012 0815   K 3.7 02/17/2012 1030   CL 112* 08/24/2012 0815   CL 106 02/17/2012 1030   CO2 22 08/24/2012 0815   CO2 24 02/17/2012 1030   BUN 12.0 08/24/2012 0815   BUN 10 02/17/2012 1030   CREATININE 0.5* 08/24/2012 0815    CREATININE 0.87 02/17/2012 1030      Component Value Date/Time   CALCIUM 9.4 08/24/2012 0815   CALCIUM 8.6 02/17/2012 1030   ALKPHOS 143 08/24/2012 0815   ALKPHOS 65 02/17/2012 1030   AST 29 08/24/2012 0815   AST 16 02/17/2012 1030   ALT 46 08/24/2012 0815   ALT 17 02/17/2012 1030   BILITOT 0.56 08/24/2012 0815   BILITOT 0.3 02/17/2012 1030     Serum quantitative hCG  Was normal at <2 on 08/24/2012.  STUDIES:   No results found.   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   PLAN:  [name] continues to do well, with no evidence of disease recurrence at this time. We will continue to see her every 3 months, her next appointment being scheduled for MArch 2014.  The overall plan is for her 2 year followup from tumor marker normalization.   She knows to call for any problems that may develop before the next visit.   Florena Kozma    08/31/2012

## 2012-10-19 ENCOUNTER — Ambulatory Visit: Payer: BC Managed Care – PPO | Admitting: Obstetrics and Gynecology

## 2012-10-19 DIAGNOSIS — Z331 Pregnant state, incidental: Secondary | ICD-10-CM

## 2012-10-20 LAB — PRENATAL PANEL VII
Antibody Screen: NEGATIVE
Basophils Relative: 0 % (ref 0–1)
Eosinophils Absolute: 0.1 10*3/uL (ref 0.0–0.7)
Eosinophils Relative: 1 % (ref 0–5)
HCT: 37.4 % (ref 36.0–46.0)
Hemoglobin: 12.6 g/dL (ref 12.0–15.0)
MCH: 26.1 pg (ref 26.0–34.0)
MCHC: 33.7 g/dL (ref 30.0–36.0)
MCV: 77.6 fL — ABNORMAL LOW (ref 78.0–100.0)
Monocytes Absolute: 0.6 10*3/uL (ref 0.1–1.0)
Monocytes Relative: 5 % (ref 3–12)
Neutrophils Relative %: 77 % (ref 43–77)
Rh Type: POSITIVE

## 2012-10-20 LAB — POCT URINALYSIS DIPSTICK
Bilirubin, UA: NEGATIVE
Nitrite, UA: NEGATIVE
Urobilinogen, UA: 1

## 2012-10-20 NOTE — Progress Notes (Signed)
NOB interview completed.  PNV samples given.  NOB work up w/ Alvino Chapel, CNM on 10/22/12.

## 2012-10-21 ENCOUNTER — Encounter: Payer: Self-pay | Admitting: Certified Nurse Midwife

## 2012-10-21 LAB — GC/CHLAMYDIA PROBE AMP, URINE: GC Probe Amp, Urine: NEGATIVE

## 2012-10-21 LAB — HEMOGLOBINOPATHY EVALUATION
Hgb A2 Quant: 2.6 % (ref 2.2–3.2)
Hgb A: 94.6 % — ABNORMAL LOW (ref 96.8–97.8)

## 2012-10-22 ENCOUNTER — Telehealth: Payer: Self-pay

## 2012-10-22 ENCOUNTER — Other Ambulatory Visit: Payer: Self-pay | Admitting: Certified Nurse Midwife

## 2012-10-22 ENCOUNTER — Encounter: Payer: Self-pay | Admitting: Certified Nurse Midwife

## 2012-10-22 ENCOUNTER — Ambulatory Visit: Payer: BC Managed Care – PPO | Admitting: Certified Nurse Midwife

## 2012-10-22 ENCOUNTER — Ambulatory Visit: Payer: BC Managed Care – PPO

## 2012-10-22 VITALS — BP 120/70 | Wt 110.0 lb

## 2012-10-22 DIAGNOSIS — Z331 Pregnant state, incidental: Secondary | ICD-10-CM

## 2012-10-22 DIAGNOSIS — O020 Blighted ovum and nonhydatidiform mole: Secondary | ICD-10-CM

## 2012-10-22 DIAGNOSIS — O26849 Uterine size-date discrepancy, unspecified trimester: Secondary | ICD-10-CM

## 2012-10-22 DIAGNOSIS — O219 Vomiting of pregnancy, unspecified: Secondary | ICD-10-CM

## 2012-10-22 DIAGNOSIS — O2690 Pregnancy related conditions, unspecified, unspecified trimester: Secondary | ICD-10-CM

## 2012-10-22 DIAGNOSIS — O019 Hydatidiform mole, unspecified: Secondary | ICD-10-CM | POA: Insufficient documentation

## 2012-10-22 DIAGNOSIS — R9389 Abnormal findings on diagnostic imaging of other specified body structures: Secondary | ICD-10-CM

## 2012-10-22 LAB — OB RESULTS CONSOLE GC/CHLAMYDIA
Chlamydia: NEGATIVE
Gonorrhea: NEGATIVE

## 2012-10-22 MED ORDER — DOXYLAMINE-PYRIDOXINE 10-10 MG PO TBEC: 2.0000 | DELAYED_RELEASE_TABLET | Freq: Every day | ORAL | Status: DC

## 2012-10-22 NOTE — Telephone Encounter (Signed)
Message copied by Janeece Agee on Fri Oct 22, 2012  3:54 PM ------      Message from: Delon Sacramento      Created: Fri Oct 22, 2012  3:45 PM      Regarding: FW: Referral to MFM       See Alvino Chapel message.            Thanks Donnamae Jude      ----- Message -----         From: Haroldine Laws, CNM         Sent: 10/22/2012  12:39 PM           To: Delon Sacramento, RN      Subject: Referral to MFM                                          Odeth need a referral to MFM next week (earlier the better).            Reason: [redacted]w[redacted]d US showed thickened nuchal region.  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      Candise Bowens

## 2012-10-22 NOTE — Progress Notes (Signed)
Ultrasound:  EGA: 11 weeks + 0 days, S=D new EDD 05/13/2013 Comments: Mason Jim pregnancy, normal ovaries,no fluid in CDS,normal adenexas. Thickened nuchal region is seen (can not exclude early cystic hygroma ) Suggest follow -up in 2 weeks .suggest changing of EDD to today's u/s EDD due to discrepancy of LMP EDD.

## 2012-10-22 NOTE — Telephone Encounter (Signed)
Spoke with pt informing her U/S is needed at [facility] ASAP per Alvino Chapel, CNM. Appointment is scheduled 10/25/12 at 12 pm. Pt agrees and voices understanding.

## 2012-10-22 NOTE — Telephone Encounter (Signed)
Spoke with pt informing her she needs to take two Diclegis po at bedtime EVERYDAY in order for it to work properly. It was rx'd today by Alvino Chapel, CNM. Also informed pt if sx's don't subside over the weekend, to call us Monday and JO, CNM will increase the dosage. Pt agrees and voices understanding.

## 2012-10-22 NOTE — Progress Notes (Signed)
[redacted]w[redacted]d Pt has some side pain x 2 weeks on and off

## 2012-10-22 NOTE — Telephone Encounter (Signed)
Message copied by Janeece Agee on Fri Oct 22, 2012  3:12 PM ------      Message from: Haroldine Laws      Created: Fri Oct 22, 2012  3:00 PM      Regarding: Can you call this pt       Ty,            Can you call [name]...            I rx'd Diclegis and the script says       2 tabs PO daily at bedtime             Can you stress to her that this is not to be taken PRN.  For it to work she has to take it everyday.            Also...            If her symptoms persist she can call in on Monday and I can increase her dose.            Thanks!      Candise Bowens

## 2012-10-22 NOTE — Progress Notes (Signed)
   [name] is being seen today for her first obstetrical visit at [redacted]w[redacted]d gestation by LMP.  US done today for viability d/t h/o molar pregnancy in 2012.  US showed a discrepancy of 5 days -- EDD was not changed from 05/18/13.  She reports all day nausea with vomiting occuring up to 2 x a day -- rx given.  She also reports round ligament discomfort - rv'd cause of discomfort and discussed comfort measures.  Her obstetrical history is significant for: Patient Active Problem List  Diagnosis  . Molar pregnancy  . H/o Hypokalemia  . Ultrasound scan abnormal  . 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  She went through chemo finishing in Oct 2012. [place] at NOB visit showed thickened nuchal region and they could not exclude early cystic hygroma -- referred to MFM  Hypokalemia - K=3.8 on 08/24/12     Relationship with FOB:  Married to [employer]  Employment is unknown at this time.  Feeding plan:   Breastfeeding  Pregnancy history fully reviewed.  The following portions of the patient's history were reviewed and updated as appropriate: allergies, current medications, past family history, past medical history, past social history, past surgical history and problem list.  Review of Systems Pertinent ROS is described in HPI   Objective:   BP 120/70  Wt 110 lb (49.896 kg)  LMP 08/11/2012  Breastfeeding? Unknown Wt Readings from Last 1 Encounters:  10/22/12 110 lb (49.896 kg)   BMI: There is no height on file to calculate BMI.  General: alert, cooperative and no distress HEENT: grossly normal  Thyroid: normal  Respiratory: clear to auscultation bilaterally Cardiovascular: regular rate and rhythm,  Breasts:  No dominant masses, nipples erect Gastrointestinal: soft, non-tender; no masses,  no  organomegaly Extremities: extremities normal, no pain or edema Vaginal Bleeding: None  EXTERNAL GENITALIA: normal appearing vulva with no masses, tenderness or lesions VAGINA: no abnormal discharge or lesions CERVIX: no lesions or cervical motion tenderness; cervix closed, long, firm UTERUS: gravid and consistent with 10 weeks ADNEXA: no masses palpable and nontender OB EXAM PELVIMETRY: appears adequate  FHR:  150  bpm  Assessment:    Pregnancy at  10w 1d H/o molar pregnancy with subsequent 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 - course of chemo ~June to October 2012 Abnormal [place] today - thickened nuchal region H/o hypokalemia N/V in pregnancy Round ligament discomfort  Plan:     Prenatal panel reviewed and discussed with the patient:  yes  Pap smear collected:  Last pap 08/2012 GC/Chlamydia collected:  yes Wet prep:  Neg Discussion of Genetic testing options: Unsure today, [place] recommended genetic testing referred to MFM Prenatal vitamins recommended  Rx'd Diclegis 2 tabs PO daily at bedtime (not to be taken PRN) If symptoms persist, will add 1 tab daily in the morning (can start day 3) If symptoms persist, will add 1 tab daily in the afternoon (can start day 4)  Plan of care: Next visit:  4 weeks for ROB Other anticipated f/u:    MFM early next week.  Haroldine Laws, CNM, MSN

## 2012-10-23 LAB — GC/CHLAMYDIA PROBE AMP: CT Probe RNA: NEGATIVE

## 2012-10-24 LAB — CULTURE, OB URINE

## 2012-10-25 ENCOUNTER — Ambulatory Visit (HOSPITAL_COMMUNITY)
Admission: RE | Admit: 2012-10-25 | Discharge: 2012-10-25 | Disposition: A | Discharge status: Home / Self Care | Payer: BC Managed Care – PPO | Source: Ambulatory Visit | Attending: Obstetrics and Gynecology | Admitting: Obstetrics and Gynecology

## 2012-10-25 ENCOUNTER — Telehealth: Payer: Self-pay

## 2012-10-25 VITALS — BP 119/76 | HR 108 | Wt 112.5 lb

## 2012-10-25 DIAGNOSIS — O352XX0 Maternal care for (suspected) hereditary disease in fetus, not applicable or unspecified: Secondary | ICD-10-CM | POA: Insufficient documentation

## 2012-10-25 DIAGNOSIS — R9389 Abnormal findings on diagnostic imaging of other specified body structures: Secondary | ICD-10-CM

## 2012-10-25 DIAGNOSIS — O019 Hydatidiform mole, unspecified: Secondary | ICD-10-CM

## 2012-10-25 DIAGNOSIS — O2690 Pregnancy related conditions, unspecified, unspecified trimester: Secondary | ICD-10-CM

## 2012-10-25 DIAGNOSIS — Z331 Pregnant state, incidental: Secondary | ICD-10-CM

## 2012-10-25 DIAGNOSIS — O358XX0 Maternal care for other (suspected) fetal abnormality and damage, not applicable or unspecified: Secondary | ICD-10-CM | POA: Insufficient documentation

## 2012-10-25 DIAGNOSIS — Z87730 Personal history of (corrected) cleft lip and palate: Secondary | ICD-10-CM | POA: Insufficient documentation

## 2012-10-25 NOTE — Telephone Encounter (Signed)
Message copied by Rolla Plate on Mon Oct 25, 2012  8:37 AM ------      Message from: Jaymes Graff      Created: Fri Oct 22, 2012  3:01 PM      Regarding: RE: Question re: Hemoglobinopathy results       Please schedule first trimester genetic screening and visit with first available MD.        ----- Message -----         From: Haroldine Laws, CNM         Sent: 10/22/2012   2:46 PM           To: Michael Litter, MD      Subject: Question re: Hemoglobinopathy results                    [name]'s results were:       Hgb A 94.6 (L)      Hgb F Quant: 2.8 (H)            The interpretation of these results are below however, with her problem list (also listed below) I was wondering if any further action would be required.  Of note, I referred to MFM d/t her [place] today.            Interpretation      Hb-F is slightly increased for the patient's reported age. Hb-A2 is of normal concentration. No abnormal hemoglobin is identified. The elevated Hb-F may be due to an acquired increase with rapid      regeneration of erythropoiesis from any cause. Alternatively, the elevated Hb-F may be due to the hereditary persistence of Hb-F, which is of no known physiologic significance.            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  She went through chemo finishing in Oct 2012.            3) [place] at NOB visit showed thickened nuchal region and they could not exclude early cystic hygroma

## 2012-10-25 NOTE — Telephone Encounter (Signed)
Spoke with pt rgd appt pt has appt for first trimester screen and rob visit 11/03/12 at 9:00 pt voice understanding

## 2012-10-25 NOTE — Progress Notes (Signed)
[name]  was seen today for an ultrasound appointment.  See full report in [facility].  Comments: [name] was seen today due to possible cystic hygroma / increased nuchal translucency on recent clinic ultrasound.  Her prior OB history is remarkable for a prior molar pregnancy.  She is currently too early for first trimester screen.  Impression: Single IUP at 10 5/7 weeks An NT of approximately 2.2 mm was noted.   A cystic hygroma is not appreciated.  Recommendations: Recommend follow-up ultrasound examination next week for first trimester screen. Ultrasound for fetal anatomy at 18 weeks due to personal history of cleft lip.  Alpha Gula, MD

## 2012-10-26 NOTE — Progress Notes (Signed)
Genetic Counseling  High-Risk Gestation Note  Appointment Date:  10/25/2012 Referred By: Nigel Bridgeman, CNM Date of Birth:  15-Feb-1981   Pregnancy History: [id number] Estimated Date of Delivery: 05/18/13 Estimated Gestational Age: [redacted]w[redacted]d Attending: Alpha Gula, MD   [name] was seen for genetic counseling because of previous ultrasound findings of possible increased nuchal translucency vs cystic hygroma.   Ultrasound performed at the time of today's visit visualized a nuchal translucency measurement of 2.2 mm and a crown rump length of 44.6 mm. Cystic hygroma was not appreciated at the time of today's visit. Complete ultrasound results reported separately.   We discussed that nuchal translucency is typically assessed when the fetal crown rump length is between 45 mm and 84 mm. Thus, the nuchal translucency measurement cannot accurately be interpreted at the current gestational age. We discussed that if an increased NT measurement is visualized in pregnancy, there are various common etiologies for an increased NT including: aneuploidy, single gene conditions, cardiac or great vessel abnormalities, lymphatic system failure, decreased fetal movement, and fetal anemia.    She was counseled regarding available screening options, including First trimester screen. We reviewed chromosomes, nondisjunction, and the examples of chromosome conditions including Down syndrome (trisomy 74) and trisomy 18/13. We discussed that screening does not diagnose or rule out these conditions but rather provides a pregnancy specific risk assessment. [name] elected to proceed with first trimester screening next week.    Both family histories were reviewed and found to be contributory for a personal history of cleft lip for [name], which was surgically corrected after birth at [facility]. The patient does not know if palate was also involved. No additional relatives were reported with cleft lip +/- palate. We  discussed the ultrasound finding of cleft lip +/- palate. In normal embryological development, the fetal lip usually closes by 7-[redacted] weeks gestation and the fetal palate usually closes by [redacted] weeks gestation.  When parts of these structures do not fuse properly, cleft lip and/or palate (CL/P) results.  CL/P is twice as common in males as it is in females. The incidence of CL/P varies in different ethnic populations; it occurs in approximately 1 in 1,000 Caucasian births.  In addition to ethnicity, other factors may increase the chance of a CL/P including some prenatal exposures, alcohol and drug use, cigarette smoking, or folic acid deficiency.  CL/P is most often an isolated condition, but can be present in combination with other birth defects possibly as part of a genetic syndrome. Approximately, 7-13% of individuals with a cleft lip and 11-14% of individuals with a cleft lip and palate are born with additional birth defects.  Many genetic syndromes are associated with cleft lip and/or palate which may not be identified on ultrasound and would not be detected by amniocentesis. When there is no syndrome as the cause, then the cleft lip or palate is typically suspected to be caused by a combination of genetic and environmental factors (multifactorial inheritance). Given that the cleft lip for [name] appears to be most likely isolated, given her report, and assuming multifactorial inheritance, we discussed that recurrence risk for cleft lip +/- palate in the current pregnancy is approximately 4%. We discussed that option of targeted ultrasound in the second trimester to assess for orofacial clefts. However, prenatal ultrasound cannot diagnose or rule out all birth defects.   Additionally, the patient reported that her previous pregnancy was a molar pregnancy in May 2012. She was treated with chemotherapy between June 2012  and October 2012. No additional relatives were reported with molar pregnancy or history of  pregnancy loss. A hydatidiform molar pregnancy is one that either inherits two complete sets of the father's genetic information and no maternal information (called a complete mole, with 46 chromosomes total), or two sets from the father as well as one set from the mother (called a partial mole, resulting in triploidy).  Normally, a pregnancy should inherit one complete set of information from the mother and one set from the father.  In a complete molar pregnancy, the placental tissue is abnormal, large and looks vesicular.  No embryo is present.  In a partial molar pregnancy, placental and embryonic tissue is present.  Some of this tissue may be normal.  Complete and partial molar pregnancies generally result in an early miscarriage.   Once a couple has had one molar pregnancy, the chance to have another molar pregnancy, either complete or partial, is reported to be approximately 1%-1.5%.   This is generally not inherited, or passed from parent to child.  However, there are rare reports in the medical literature describing some women who have had recurrent molar pregnancies, which may be due to an underlying autosomal recessive condition.  In these rare cases, it is thought that the mother may have inherited two nonworking copies of the same gene, which may predispose her to have molar pregnancies.  We discussed the option of ultrasound surveillance in the current and future pregnancies to assess for possible features of a molar pregnancy.  Without further information regarding the provided family history, an accurate genetic risk cannot be calculated. Further genetic counseling is warranted if more information is obtained.  [name] denied exposure to environmental toxins or chemical agents. She denied the use of alcohol, tobacco or street drugs. She denied significant viral illnesses during the course of her pregnancy. Her medical and surgical histories were contributory for molar pregnancy in 2012, as  previously discussed.   I counseled [name] for approximately 25 minutes regarding the above risks and available options.     Jillian Plowman, MS,  Certified Genetic Counselor  10/26/2012

## 2012-10-29 ENCOUNTER — Encounter: Payer: Self-pay | Admitting: Obstetrics and Gynecology

## 2012-11-01 ENCOUNTER — Encounter (HOSPITAL_COMMUNITY): Payer: Self-pay

## 2012-11-01 ENCOUNTER — Ambulatory Visit (HOSPITAL_COMMUNITY)
Admission: RE | Admit: 2012-11-01 | Discharge: 2012-11-01 | Disposition: A | Discharge status: Home / Self Care | Payer: BC Managed Care – PPO | Source: Ambulatory Visit | Attending: Obstetrics and Gynecology | Admitting: Obstetrics and Gynecology

## 2012-11-01 ENCOUNTER — Other Ambulatory Visit: Payer: Self-pay

## 2012-11-01 VITALS — BP 141/87 | HR 115 | Wt 113.0 lb

## 2012-11-01 DIAGNOSIS — R9389 Abnormal findings on diagnostic imaging of other specified body structures: Secondary | ICD-10-CM

## 2012-11-01 DIAGNOSIS — O020 Blighted ovum and nonhydatidiform mole: Secondary | ICD-10-CM

## 2012-11-01 DIAGNOSIS — Z87730 Personal history of (corrected) cleft lip and palate: Secondary | ICD-10-CM

## 2012-11-01 DIAGNOSIS — Z3689 Encounter for other specified antenatal screening: Secondary | ICD-10-CM | POA: Insufficient documentation

## 2012-11-01 DIAGNOSIS — O351XX0 Maternal care for (suspected) chromosomal abnormality in fetus, not applicable or unspecified: Secondary | ICD-10-CM | POA: Insufficient documentation

## 2012-11-01 DIAGNOSIS — O352XX0 Maternal care for (suspected) hereditary disease in fetus, not applicable or unspecified: Secondary | ICD-10-CM | POA: Insufficient documentation

## 2012-11-01 DIAGNOSIS — O019 Hydatidiform mole, unspecified: Secondary | ICD-10-CM

## 2012-11-01 DIAGNOSIS — O3510X0 Maternal care for (suspected) chromosomal abnormality in fetus, unspecified, not applicable or unspecified: Secondary | ICD-10-CM | POA: Insufficient documentation

## 2012-11-01 NOTE — Progress Notes (Signed)
[name]  was seen today for an ultrasound appointment.  See full report in [facility].  Impression Single IUP at 11 5/7 weels NT of 1.5 mm noted.  A nasal bone was visualized. First trimester aneuploidy screen performed as noted above.    Recommendations Please do not draw triple/quad screen, though patient should be offered MSAFP for neural tube defect screening.  Recommend follow up in 6 weeks for detailed anatomy scan.  Alpha Gula, MD

## 2012-11-03 ENCOUNTER — Ambulatory Visit: Payer: BC Managed Care – PPO | Admitting: Obstetrics and Gynecology

## 2012-11-03 ENCOUNTER — Ambulatory Visit: Payer: BC Managed Care – PPO

## 2012-11-03 VITALS — BP 110/60 | Wt 111.0 lb

## 2012-11-03 DIAGNOSIS — Z331 Pregnant state, incidental: Secondary | ICD-10-CM

## 2012-11-03 NOTE — Progress Notes (Signed)
[redacted]w[redacted]d The patient says that she is doing well. The patient has seen maternal fetal medicine specialist and a genetic specialist.  There is no evidence of a molar pregnancy.  There is no evidence of a cystic hygroma.  The first trimester screen was normal.  The patient has had 2 ultrasounds performed by paternal fetal medicine with a due date of May 18, 2013.  I believe this is the best EDC. Return office in 4 weeks.  Alpha-fetoprotein screen next visit. Anatomy ultrasound between 18 and [redacted] weeks gestation. Dr. Stefano Gaul

## 2012-11-03 NOTE — Progress Notes (Signed)
[redacted]w[redacted]d No complaints today.

## 2012-11-06 ENCOUNTER — Other Ambulatory Visit: Payer: Self-pay

## 2012-11-18 ENCOUNTER — Encounter: Payer: BC Managed Care – PPO | Admitting: Obstetrics and Gynecology

## 2012-11-18 ENCOUNTER — Encounter: Payer: BC Managed Care – PPO | Admitting: Certified Nurse Midwife

## 2012-11-30 ENCOUNTER — Encounter: Payer: Self-pay | Admitting: Obstetrics and Gynecology

## 2012-11-30 ENCOUNTER — Ambulatory Visit: Payer: BC Managed Care – PPO | Admitting: Obstetrics and Gynecology

## 2012-11-30 VITALS — BP 110/76 | Wt 119.0 lb

## 2012-11-30 DIAGNOSIS — Z331 Pregnant state, incidental: Secondary | ICD-10-CM

## 2012-11-30 NOTE — Progress Notes (Signed)
Pt w/o complaint.  

## 2012-11-30 NOTE — Patient Instructions (Signed)
ABCs of Pregnancy  A  Antepartum care is very important. Be sure you see your doctor and get prenatal care as soon as you think you are pregnant. At this time, you will be tested for infection, genetic abnormalities and potential problems with you and the pregnancy. This is the time to discuss diet, exercise, work, medications, labor, pain medication during labor and the possibility of a cesarean delivery. Ask any questions that may concern you. It is important to see your doctor regularly throughout your pregnancy. Avoid exposure to toxic substances and chemicals - such as cleaning solvents, lead and mercury, some insecticides, and paint. Pregnant women should avoid exposure to paint fumes, and fumes that cause you to feel ill, dizzy or faint. When possible, it is a good idea to have a pre-pregnancy consultation with your caregiver to begin some important recommendations your caregiver suggests such as, taking folic acid, exercising, quitting smoking, avoiding alcoholic beverages, etc.  B  Breastfeeding is the healthiest choice for both you and your baby. It has many nutritional benefits for the baby and health benefits for the mother. It also creates a very tight and loving bond between the baby and mother. Talk to your doctor, your family and friends, and your employer about how you choose to feed your baby and how they can support you in your decision. Not all birth defects can be prevented, but a woman can take actions that may increase her chance of having a healthy baby. Many birth defects happen very early in pregnancy, sometimes before a woman even knows she is pregnant. Birth defects or abnormalities of any child in your or the father's family should be discussed with your caregiver. Get a good support bra as your breast size changes. Wear it especially when you exercise and when nursing.   C  Celebrate the news of your pregnancy with the your spouse/father and family. Childbirth classes are helpful to  take for you and the spouse/father because it helps to understand what happens during the pregnancy, labor and delivery. Cesarean delivery should be discussed with your doctor so you are prepared for that possibility. The pros and cons of circumcision if it is a boy, should be discussed with your pediatrician. Cigarette smoking during pregnancy can result in low birth weight babies. It has been associated with infertility, miscarriages, tubal pregnancies, infant death (mortality) and poor health (morbidity) in childhood. Additionally, cigarette smoking may cause long-term learning disabilities. If you smoke, you should try to quit before getting pregnant and not smoke during the pregnancy. Secondary smoke may also harm a mother and her developing baby. It is a good idea to ask people to stop smoking around you during your pregnancy and after the baby is born. Extra calcium is necessary when you are pregnant and is found in your prenatal vitamin, in dairy products, green leafy vegetables and in calcium supplements.  D  A healthy diet according to your current weight and height, along with vitamins and mineral supplements should be discussed with your caregiver. Domestic abuse or violence should be made known to your doctor right away to get the situation corrected. Drink more water when you exercise to keep hydrated. Discomfort of your back and legs usually develops and progresses from the middle of the second trimester through to delivery of the baby. This is because of the enlarging baby and uterus, which may also affect your balance. Do not take illegal drugs. Illegal drugs can seriously harm the baby and you. Drink extra   fluids (water is best) throughout pregnancy to help your body keep up with the increases in your blood volume. Drink at least 6 to 8 glasses of water, fruit juice, or milk each day. A good way to know you are drinking enough fluid is when your urine looks almost like clear water or is very light  yellow.   E  Eat healthy to get the nutrients you and your unborn baby need. Your meals should include the five basic food groups. Exercise (30 minutes of light to moderate exercise a day) is important and encouraged during pregnancy, if there are no medical problems or problems with the pregnancy. Exercise that causes discomfort or dizziness should be stopped and reported to your caregiver. Emotions during pregnancy can change from being ecstatic to depression and should be understood by you, your partner and your family.  F  Fetal screening with ultrasound, amniocentesis and monitoring during pregnancy and labor is common and sometimes necessary. Take 400 micrograms of folic acid daily both before, when possible, and during the first few months of pregnancy to reduce the risk of birth defects of the brain and spine. All women who could possibly become pregnant should take a vitamin with folic acid, every day. It is also important to eat a healthy diet with fortified foods (enriched grain products, including cereals, rice, breads, and pastas) and foods with natural sources of folate (orange juice, green leafy vegetables, beans, peanuts, broccoli, asparagus, peas, and lentils). The father should be involved with all aspects of the pregnancy including, the prenatal care, childbirth classes, labor, delivery, and postpartum time. Fathers may also have emotional concerns about being a father, financial needs, and raising a family.  G  Genetic testing should be done appropriately. It is important to know your family and the father's history. If there have been problems with pregnancies or birth defects in your family, report these to your doctor. Also, genetic counselors can talk with you about the information you might need in making decisions about having a family. You can call a major medical center in your area for help in finding a board-certified genetic counselor. Genetic testing and counseling should be done  before pregnancy when possible, especially if there is a history of problems in the mother's or father's family. Certain ethnic backgrounds are more at risk for genetic defects.  H  Get familiar with the hospital where you will be having your baby. Get to know how long it takes to get there, the labor and delivery area, and the hospital procedures. Be sure your medical insurance is accepted there. Get your home ready for the baby including, clothes, the baby's room (when possible), furniture and car seat. Hand washing is important throughout the day, especially after handling raw meat and poultry, changing the baby's diaper or using the bathroom. This can help prevent the spread of many bacteria and viruses that cause infection. Your hair may become dry and thinner, but will return to normal a few weeks after the baby is born. Heartburn is a common problem that can be treated by taking antacids recommended by your caregiver, eating smaller meals 5 or 6 times a day, not drinking liquids when eating, drinking between meals and raising the head of your bed 2 to 3 inches.  I  Insurance to cover you, the baby, doctor and hospital should be reviewed so that you will be prepared to pay any costs not covered by your insurance plan. If you do not have medical insurance,   there are usually clinics and services available for you in your community. Take 30 milligrams of iron during your pregnancy as prescribed by your doctor to reduce the risk of low red blood cells (anemia) later in pregnancy. All women of childbearing age should eat a diet rich in iron.  J  There should be a joint effort for the mother, father and any other children to adapt to the pregnancy financially, emotionally, and psychologically during the pregnancy. Join a support group for moms-to-be. Or, join a class on parenting or childbirth. Have the family participate when possible.  K  Know your limits. Let your caregiver know if you experience any of the  following:   · Pain of any kind.  · Strong cramps.  · You develop a lot of weight in a short period of time (5 pounds in 3 to 5 days).  · Vaginal bleeding, leaking of amniotic fluid.  · Headache, vision problems.  · Dizziness, fainting, shortness of breath.  · Chest pain.  · Fever of 102° F (38.9° C) or higher.  · Gush of clear fluid from your vagina.  · Painful urination.  · Domestic violence.  · Irregular heartbeat (palpitations).  · Rapid beating of the heart (tachycardia).  · Constant feeling sick to your stomach (nauseous) and vomiting.  · Trouble walking, fluid retention (edema).  · Muscle weakness.  · If your baby has decreased activity.  · Persistent diarrhea.  · Abnormal vaginal discharge.  · Uterine contractions at 20-minute intervals.  · Back pain that travels down your leg.  L  Learn and practice that what you eat and drink should be in moderation and healthy for you and your baby. Legal drugs such as alcohol and caffeine are important issues for pregnant women. There is no safe amount of alcohol a woman can drink while pregnant. Fetal alcohol syndrome, a disorder characterized by growth retardation, facial abnormalities, and central nervous system dysfunction, is caused by a woman's use of alcohol during pregnancy. Caffeine, found in tea, coffee, soft drinks and chocolate, should also be limited. Be sure to read labels when trying to cut down on caffeine during pregnancy. More than 200 foods, beverages, and over-the-counter medications contain caffeine and have a high salt content! There are coffees and teas that do not contain caffeine.  M  Medical conditions such as diabetes, epilepsy, and high blood pressure should be treated and kept under control before pregnancy when possible, but especially during pregnancy. Ask your caregiver about any medications that may need to be changed or adjusted during pregnancy. If you are currently taking any medications, ask your caregiver if it is safe to take them  while you are pregnant or before getting pregnant when possible. Also, be sure to discuss any herbs or vitamins you are taking. They are medicines, too! Discuss with your doctor all medications, prescribed and over-the-counter, that you are taking. During your prenatal visit, discuss the medications your doctor may give you during labor and delivery.  N  Never be afraid to ask your doctor or caregiver questions about your health, the progress of the pregnancy, family problems, stressful situations, and recommendation for a pediatrician, if you do not have one. It is better to take all precautions and discuss any questions or concerns you may have during your office visits. It is a good idea to write down your questions before you visit the doctor.  O  Over-the-counter cough and cold remedies may contain alcohol or other ingredients that should   be avoided during pregnancy. Ask your caregiver about prescription, herbs or over-the-counter medications that you are taking or may consider taking while pregnant.   P  Physical activity during pregnancy can benefit both you and your baby by lessening discomfort and fatigue, providing a sense of well-being, and increasing the likelihood of early recovery after delivery. Light to moderate exercise during pregnancy strengthens the belly (abdominal) and back muscles. This helps improve posture. Practicing yoga, walking, swimming, and cycling on a stationary bicycle are usually safe exercises for pregnant women. Avoid scuba diving, exercise at high altitudes (over 3000 feet), skiing, horseback riding, contact sports, etc. Always check with your doctor before beginning any kind of exercise, especially during pregnancy and especially if you did not exercise before getting pregnant.  Q  Queasiness, stomach upset and morning sickness are common during pregnancy. Eating a couple of crackers or dry toast before getting out of bed. Foods that you normally love may make you feel sick to  your stomach. You may need to substitute other nutritious foods. Eating 5 or 6 small meals a day instead of 3 large ones may make you feel better. Do not drink with your meals, drink between meals. Questions that you have should be written down and asked during your prenatal visits.  R  Read about and make plans to baby-proof your home. There are important tips for making your home a safer environment for your baby. Review the tips and make your home safer for you and your baby. Read food labels regarding calories, salt and fat content in the food.  S  Saunas, hot tubs, and steam rooms should be avoided while you are pregnant. Excessive high heat may be harmful during your pregnancy. Your caregiver will screen and examine you for sexually transmitted diseases and genetic disorders during your prenatal visits. Learn the signs of labor. Sexual relations while pregnant is safe unless there is a medical or pregnancy problem and your caregiver advises against it.  T  Traveling long distances should be avoided especially in the third trimester of your pregnancy. If you do have to travel out of state, be sure to take a copy of your medical records and medical insurance plan with you. You should not travel long distances without seeing your doctor first. Most airlines will not allow you to travel after 36 weeks of pregnancy. Toxoplasmosis is an infection caused by a parasite that can seriously harm an unborn baby. Avoid eating undercooked meat and handling cat litter. Be sure to wear gloves when gardening. Tingling of the hands and fingers is not unusual and is due to fluid retention. This will go away after the baby is born.  U  Womb (uterus) size increases during the first trimester. Your kidneys will begin to function more efficiently. This may cause you to feel the need to urinate more often. You may also leak urine when sneezing, coughing or laughing. This is due to the growing uterus pressing against your bladder,  which lies directly in front of and slightly under the uterus during the first few months of pregnancy. If you experience burning along with frequency of urination or bloody urine, be sure to tell your doctor. The size of your uterus in the third trimester may cause a problem with your balance. It is advisable to maintain good posture and avoid wearing high heels during this time. An ultrasound of your baby may be necessary during your pregnancy and is safe for you and your baby.  V    Vaccinations are an important concern for pregnant women. Get needed vaccines before pregnancy. Center for Disease Control (www.cdc.gov) has clear guidelines for the use of vaccines during pregnancy. Review the list, be sure to discuss it with your doctor. Prenatal vitamins are helpful and healthy for you and the baby. Do not take extra vitamins except what is recommended. Taking too much of certain vitamins can cause overdose problems. Continuous vomiting should be reported to your caregiver. Varicose veins may appear especially if there is a family history of varicose veins. They should subside after the delivery of the baby. Support hose helps if there is leg discomfort.  W  Being overweight or underweight during pregnancy may cause problems. Try to get within 15 pounds of your ideal weight before pregnancy. Remember, pregnancy is not a time to be dieting! Do not stop eating or start skipping meals as your weight increases. Both you and your baby need the calories and nutrition you receive from a healthy diet. Be sure to consult with your doctor about your diet. There is a formula and diet plan available depending on whether you are overweight or underweight. Your caregiver or nutritionist can help and advise you if necessary.  X  Avoid X-rays. If you must have dental work or diagnostic tests, tell your dentist or physician that you are pregnant so that extra care can be taken. X-rays should only be taken when the risks of not taking  them outweigh the risk of taking them. If needed, only the minimum amount of radiation should be used. When X-rays are necessary, protective lead shields should be used to cover areas of the body that are not being X-rayed.  Y  Your baby loves you. Breastfeeding your baby creates a loving and very close bond between the two of you. Give your baby a healthy environment to live in while you are pregnant. Infants and children require constant care and guidance. Their health and safety should be carefully watched at all times. After the baby is born, rest or take a nap when the baby is sleeping.  Z  Get your ZZZs. Be sure to get plenty of rest. Resting on your side as often as possible, especially on your left side is advised. It provides the best circulation to your baby and helps reduce swelling. Try taking a nap for 30 to 45 minutes in the afternoon when possible. After the baby is born rest or take a nap when the baby is sleeping. Try elevating your feet for that amount of time when possible. It helps the circulation in your legs and helps reduce swelling.   Most information courtesy of the CDC.  Document Released: 09/08/2005 Document Revised: 12/01/2011 Document Reviewed: 05/23/2009  [employer]® Patient Information ©2013 [employer].

## 2012-11-30 NOTE — Progress Notes (Signed)
[redacted]w[redacted]d Pt without c/o AFP and anatomy [facility] 

## 2012-12-06 ENCOUNTER — Other Ambulatory Visit (HOSPITAL_BASED_OUTPATIENT_CLINIC_OR_DEPARTMENT_OTHER): Payer: BC Managed Care – PPO | Admitting: Lab

## 2012-12-06 DIAGNOSIS — O020 Blighted ovum and nonhydatidiform mole: Secondary | ICD-10-CM

## 2012-12-06 DIAGNOSIS — D392 Neoplasm of uncertain behavior of placenta: Secondary | ICD-10-CM

## 2012-12-06 DIAGNOSIS — E876 Hypokalemia: Secondary | ICD-10-CM

## 2012-12-06 LAB — COMPREHENSIVE METABOLIC PANEL (CC13)
Alkaline Phosphatase: 78 U/L (ref 40–150)
BUN: 9.1 mg/dL (ref 7.0–26.0)
Glucose: 143 mg/dl — ABNORMAL HIGH (ref 70–99)
Sodium: 136 mEq/L (ref 136–145)
Total Bilirubin: 0.31 mg/dL (ref 0.20–1.20)

## 2012-12-06 LAB — CBC WITH DIFFERENTIAL/PLATELET
Basophils Absolute: 0.1 10*3/uL (ref 0.0–0.1)
Eosinophils Absolute: 0.1 10*3/uL (ref 0.0–0.5)
LYMPH%: 14.2 % (ref 14.0–49.7)
MCV: 82.1 fL (ref 79.5–101.0)
MONO%: 3.6 % (ref 0.0–14.0)
NEUT#: 12.1 10*3/uL — ABNORMAL HIGH (ref 1.5–6.5)
Platelets: 229 10*3/uL (ref 145–400)
RBC: 4.4 10*6/uL (ref 3.70–5.45)

## 2012-12-13 ENCOUNTER — Ambulatory Visit (HOSPITAL_COMMUNITY)
Admission: RE | Admit: 2012-12-13 | Discharge: 2012-12-13 | Disposition: A | Discharge status: Home / Self Care | Payer: Medicaid Other | Source: Ambulatory Visit | Attending: Obstetrics and Gynecology | Admitting: Obstetrics and Gynecology

## 2012-12-13 ENCOUNTER — Ambulatory Visit: Payer: BC Managed Care – PPO | Admitting: Oncology

## 2012-12-13 DIAGNOSIS — Z363 Encounter for antenatal screening for malformations: Secondary | ICD-10-CM | POA: Insufficient documentation

## 2012-12-13 DIAGNOSIS — O352XX0 Maternal care for (suspected) hereditary disease in fetus, not applicable or unspecified: Secondary | ICD-10-CM | POA: Insufficient documentation

## 2012-12-13 DIAGNOSIS — R9389 Abnormal findings on diagnostic imaging of other specified body structures: Secondary | ICD-10-CM

## 2012-12-13 DIAGNOSIS — O09299 Supervision of pregnancy with other poor reproductive or obstetric history, unspecified trimester: Secondary | ICD-10-CM | POA: Insufficient documentation

## 2012-12-13 DIAGNOSIS — Z87730 Personal history of (corrected) cleft lip and palate: Secondary | ICD-10-CM

## 2012-12-13 DIAGNOSIS — Z1389 Encounter for screening for other disorder: Secondary | ICD-10-CM | POA: Insufficient documentation

## 2012-12-13 DIAGNOSIS — O020 Blighted ovum and nonhydatidiform mole: Secondary | ICD-10-CM

## 2012-12-13 DIAGNOSIS — O358XX0 Maternal care for other (suspected) fetal abnormality and damage, not applicable or unspecified: Secondary | ICD-10-CM | POA: Insufficient documentation

## 2012-12-13 NOTE — Progress Notes (Signed)
[name]  was seen today for an ultrasound appointment.  See full report in [facility].  Comments: An echogenic focus was seen in the left cardiac ventricle.  This is felt to represent a calcified papillary muscle, and is not associated with structural or functional cardiac abnormalities.  Although an echogenic cardiac focus may be associated with an increased risk of Down syndrome, this risk is felt to be minimal, especially when it is seen as an isolated finding.    Impression: Single IUP at 17 5/7 weeks Echogenic focus noted in the left ventricle The remainder of the detailed fetal anatomy was within normal limits No other markers associated with aneuploidy noted Normal amniotic fluid volume  Recommendation: Follow-up ultrasounds as clinically indicated.   Alpha Gula, MD

## 2012-12-22 ENCOUNTER — Telehealth: Payer: Self-pay | Admitting: *Deleted

## 2012-12-22 ENCOUNTER — Ambulatory Visit (HOSPITAL_BASED_OUTPATIENT_CLINIC_OR_DEPARTMENT_OTHER): Payer: BC Managed Care – PPO | Admitting: Oncology

## 2012-12-22 VITALS — BP 114/74 | HR 112 | Temp 97.9°F | Resp 20 | Ht <= 58 in | Wt 123.3 lb

## 2012-12-22 DIAGNOSIS — O020 Blighted ovum and nonhydatidiform mole: Secondary | ICD-10-CM

## 2012-12-22 DIAGNOSIS — Z331 Pregnant state, incidental: Secondary | ICD-10-CM

## 2012-12-22 DIAGNOSIS — Z87898 Personal history of other specified conditions: Secondary | ICD-10-CM

## 2012-12-22 DIAGNOSIS — Z9221 Personal history of antineoplastic chemotherapy: Secondary | ICD-10-CM

## 2012-12-22 DIAGNOSIS — O019 Hydatidiform mole, unspecified: Secondary | ICD-10-CM

## 2012-12-22 DIAGNOSIS — Z8742 Personal history of other diseases of the female genital tract: Secondary | ICD-10-CM

## 2012-12-22 NOTE — Telephone Encounter (Signed)
appts made and printed 

## 2012-12-22 NOTE — Progress Notes (Signed)
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  On 01/23/2011, she had a 1st trimester dilatation and evacuation with the pathology ([id number]) showing an 11-week molar pregnancy.  The preoperative beta-HCG was 261,077.  Repeat 2 weeks postop was down to 88,955, however, repeat May 23rd was 191,584 and on May 26th 236,711.  At that point, the patient was referred here for further evaluation and treatment.  INTERVAL HISTORY:   [name] returns today for routine followup of her history of molar pregnancy. The interval history is significant for her having become pregnant. As of today she is in her 19th week. Ultrasound 12/13/2012 showed this is going to be a girl.   REVIEW OF SYSTEMS:  She continues to work at [employer]. She walks occasionally, but does not exercise regularly. She had some nausea the first couple of months of her pregnancy but now is doing "fine". She has a little back pain. She has good bladder control. There've been no fevers, rash, or bleeding. A detailed review of systems today was otherwise noncontributory.   PAST MEDICAL HISTORY: Past Medical History  Diagnosis Date  . Hydatidiform mole     hydatidiform dx 5/12  . Abnormal Pap smear 2012    Colpo done by AVS;Last pap 08/2012;was normal  . 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  The patient's mother is alive at age 45.  The patient has 1 brother, no sisters.  GYNECOLOGIC HISTORY: GX P0. Her periods are regular; she is using contraception  SOCIAL HISTORY: The patient is an immigrant from [place] and currently works at [employer] Monday through Saturday.  She usually starts work between 10 and 11 in the  morning.  Her husband whose name is Conservation officer, nature (goes by [place]) also has immigrated from [place] and has a job Conservation officer, historic buildings."  At home, in addition to the patient and her husband, are her parents, her brother, and her brother's wife and child.   ADVANCED DIRECTIVES:  HEALTH MAINTENANCE: History  Substance Use Topics  . Smoking status: Never Smoker   . Smokeless tobacco: Never Used  . Alcohol Use: No     Colonoscopy: Never  PAP: UTD, [facility]  Bone density: Never  Lipid panel:  No Known Allergies  Current Outpatient Prescriptions  Medication Sig Dispense Refill  . Doxylamine-Pyridoxine (DICLEGIS) 10-10 MG TBEC Take 2 tablets by mouth at bedtime.  60 tablet  1  . Prenatal Vit-Fe Fumarate-FA (MULTIVITAMIN-PRENATAL) 27-0.8 MG TABS Take 1 tablet by mouth daily.       No current facility-administered medications for this visit.    OBJECTIVE: young [place] Asian woman who appears well Filed Vitals:   12/22/12 1320  BP: 114/74  Pulse: 112  Temp: 97.9 F (36.6 C)  Resp: 20     Body mass index is 25.78 kg/(m^2).    ECOG FS:0 Filed Weights   12/22/12 1320  Weight: 123 lb 4.8 oz (55.929 kg)    Sclerae unicteric Oropharynx clear No peripheral adenopathy Lungs no rales or rhonchi Heart regular rate and rhythm Abdomen soft, nontender, consistent with mid term pregnancy, stethoscope kicked during exam  MSK no focal spinal tenderness, no peripheral  edema Neuro: nonfocal, pleasant affect  LAB RESULTS:  Lab Results  Component Value Date   WBC 15.0* 12/06/2012   NEUTROABS 12.1* 12/06/2012   HGB 12.1 12/06/2012   HCT 36.1 12/06/2012   MCV 82.1 12/06/2012   PLT 229 12/06/2012      Chemistry      Component Value Date/Time   NA 136 12/06/2012 0851   NA 137 02/17/2012 1030   K 3.3* 12/06/2012 0851   K 3.7 02/17/2012 1030   CL 107 12/06/2012 0851   CL 106 02/17/2012 1030   CO2 19* 12/06/2012 0851   CO2 24 02/17/2012 1030   BUN 9.1 12/06/2012 0851   BUN 10 02/17/2012  1030   CREATININE 0.7 12/06/2012 0851   CREATININE 0.87 02/17/2012 1030      Component Value Date/Time   CALCIUM 9.4 12/06/2012 0851   CALCIUM 8.6 02/17/2012 1030   ALKPHOS 78 12/06/2012 0851   ALKPHOS 65 02/17/2012 1030   AST 12 12/06/2012 0851   AST 16 02/17/2012 1030   ALT 12 12/06/2012 0851   ALT 17 02/17/2012 1030   BILITOT 0.31 12/06/2012 0851   BILITOT 0.3 02/17/2012 1030     Serum quantitative hCG  Was normal at <2 on 08/24/2012.  STUDIES:  OB US 12/13/2012      [name] was seen today for an ultrasound appointment. See full report in [facility].  Comments:  An echogenic focus was seen in the left cardiac ventricle. This is felt to represent a calcified papillary muscle, and is not associated with structural or functional cardiac abnormalities. Although an echogenic cardiac focus may be associated with an increased risk of Down syndrome, this risk is felt to be minimal, especially when it is seen as an isolated finding.  Impression:  Single IUP at 17 5/7 weeks  Echogenic focus noted in the left ventricle  The remainder of the detailed fetal anatomy was within normal limits  No other markers associated with aneuploidy noted  Normal amniotic fluid volume  Recommendation:  Follow-up ultrasounds as clinically indicated.  Alpha Gula, MD   ASSESSMENT:  32 y.o.  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   PLAN:  [name] is a year and a half out from the completion of her chemotherapy. There is no evidence of disease recurrence. She is in her 19th week of what seems to be a normal pregnancy. She is closely followed by Dr. Stefano Gaul.  I have made her a return appointment  here for October. She knows to call for any problems that may develop before that visit. From that point we will start seeing her on a once a year basis until she completes 5 years of followup.  MAGRINAT,GUSTAV C    12/22/2012

## 2013-05-20 ENCOUNTER — Telehealth (HOSPITAL_COMMUNITY): Payer: Self-pay | Admitting: *Deleted

## 2013-05-20 LAB — OB RESULTS CONSOLE GBS: GBS: NEGATIVE

## 2013-05-20 NOTE — Telephone Encounter (Signed)
Preadmission screen  

## 2013-05-21 ENCOUNTER — Inpatient Hospital Stay (HOSPITAL_COMMUNITY)
Admission: AD | Admit: 2013-05-21 | Discharge: 2013-05-21 | Disposition: A | Discharge status: Home / Self Care | Payer: BC Managed Care – PPO | Source: Ambulatory Visit | Attending: Obstetrics and Gynecology | Admitting: Obstetrics and Gynecology

## 2013-05-21 ENCOUNTER — Encounter (HOSPITAL_COMMUNITY): Payer: Self-pay | Admitting: *Deleted

## 2013-05-21 DIAGNOSIS — O99891 Other specified diseases and conditions complicating pregnancy: Secondary | ICD-10-CM | POA: Insufficient documentation

## 2013-05-21 LAB — AMNISURE RUPTURE OF MEMBRANE (ROM) NOT AT ARMC: Amnisure ROM: NEGATIVE

## 2013-05-21 NOTE — MAU Note (Signed)
Possible SROM today at 1100 while painting, good FM,  Wednesday in office 1cm, membranes stripped with some bloody show following

## 2013-05-24 ENCOUNTER — Inpatient Hospital Stay (HOSPITAL_COMMUNITY): Payer: BC Managed Care – PPO | Admitting: Anesthesiology

## 2013-05-24 ENCOUNTER — Inpatient Hospital Stay (HOSPITAL_COMMUNITY)
Admission: AD | Admit: 2013-05-24 | Discharge: 2013-05-28 | DRG: 371 | Disposition: A | Discharge status: Home / Self Care | Payer: BC Managed Care – PPO | Source: Ambulatory Visit | Attending: Obstetrics and Gynecology | Admitting: Obstetrics and Gynecology

## 2013-05-24 ENCOUNTER — Encounter (HOSPITAL_COMMUNITY): Payer: Self-pay | Admitting: *Deleted

## 2013-05-24 ENCOUNTER — Encounter (HOSPITAL_COMMUNITY): Payer: Self-pay | Admitting: Anesthesiology

## 2013-05-24 DIAGNOSIS — O324XX0 Maternal care for high head at term, not applicable or unspecified: Secondary | ICD-10-CM | POA: Diagnosis present

## 2013-05-24 DIAGNOSIS — Z98891 History of uterine scar from previous surgery: Secondary | ICD-10-CM

## 2013-05-24 LAB — CBC
HCT: 37.5 % (ref 36.0–46.0)
Hemoglobin: 12.4 g/dL (ref 12.0–15.0)
MCV: 78.5 fL (ref 78.0–100.0)
RDW: 16.8 % — ABNORMAL HIGH (ref 11.5–15.5)
WBC: 20.2 10*3/uL — ABNORMAL HIGH (ref 4.0–10.5)

## 2013-05-24 LAB — TYPE AND SCREEN
ABO/RH(D): B POS
Antibody Screen: NEGATIVE

## 2013-05-24 MED ORDER — EPHEDRINE 5 MG/ML INJ
10.0000 mg | INTRAVENOUS | Status: DC | PRN
  Filled 2013-05-24: qty 4

## 2013-05-24 MED ORDER — PHENYLEPHRINE 40 MCG/ML (10ML) SYRINGE FOR IV PUSH (FOR BLOOD PRESSURE SUPPORT)
80.0000 ug | PREFILLED_SYRINGE | INTRAVENOUS | Status: DC | PRN
  Administered 2013-05-25 (×2): 80 ug via INTRAVENOUS
  Filled 2013-05-24: qty 5

## 2013-05-24 MED ORDER — IBUPROFEN 600 MG PO TABS: 600.0000 mg | ORAL_TABLET | Freq: Four times a day (QID) | ORAL | Status: DC | PRN

## 2013-05-24 MED ORDER — OXYTOCIN 40 UNITS IN LACTATED RINGERS INFUSION - SIMPLE MED: 62.5000 mL/h | INTRAVENOUS | Status: DC

## 2013-05-24 MED ORDER — ONDANSETRON HCL 4 MG/2ML IJ SOLN
4.0000 mg | Freq: Four times a day (QID) | INTRAMUSCULAR | Status: DC | PRN
  Administered 2013-05-24: 4 mg via INTRAVENOUS
  Filled 2013-05-24: qty 2

## 2013-05-24 MED ORDER — FENTANYL CITRATE 0.05 MG/ML IJ SOLN: 100.0000 ug | INTRAMUSCULAR | Status: DC | PRN

## 2013-05-24 MED ORDER — ACETAMINOPHEN 325 MG PO TABS: 650.0000 mg | ORAL_TABLET | ORAL | Status: DC | PRN

## 2013-05-24 MED ORDER — LACTATED RINGERS IV SOLN: 500.0000 mL | Freq: Once | INTRAVENOUS | Status: DC

## 2013-05-24 MED ORDER — DIPHENHYDRAMINE HCL 50 MG/ML IJ SOLN: 12.5000 mg | INTRAMUSCULAR | Status: DC | PRN

## 2013-05-24 MED ORDER — OXYCODONE-ACETAMINOPHEN 5-325 MG PO TABS: 1.0000 | ORAL_TABLET | ORAL | Status: DC | PRN

## 2013-05-24 MED ORDER — LACTATED RINGERS IV SOLN
500.0000 mL | INTRAVENOUS | Status: DC | PRN
  Administered 2013-05-24: 1000 mL via INTRAVENOUS

## 2013-05-24 MED ORDER — OXYTOCIN BOLUS FROM INFUSION: 500.0000 mL | INTRAVENOUS | Status: DC

## 2013-05-24 MED ORDER — FENTANYL 2.5 MCG/ML BUPIVACAINE 1/10 % EPIDURAL INFUSION (WH - ANES)
14.0000 mL/h | INTRAMUSCULAR | Status: DC | PRN
  Administered 2013-05-24: 12 mL/h via EPIDURAL
  Administered 2013-05-24: 14 mL/h via EPIDURAL
  Filled 2013-05-24 (×2): qty 125

## 2013-05-24 MED ORDER — LIDOCAINE HCL (PF) 1 % IJ SOLN
30.0000 mL | INTRAMUSCULAR | Status: DC | PRN
  Filled 2013-05-24: qty 30

## 2013-05-24 MED ORDER — LACTATED RINGERS IV SOLN
INTRAVENOUS | Status: DC
  Administered 2013-05-24 (×5): via INTRAVENOUS
  Administered 2013-05-24: 300 mL via INTRAVENOUS
  Administered 2013-05-25 (×2): via INTRAVENOUS

## 2013-05-24 MED ORDER — SODIUM BICARBONATE 8.4 % IV SOLN
INTRAVENOUS | Status: DC | PRN
  Administered 2013-05-24: 4 mL via EPIDURAL
  Administered 2013-05-25: 5 mL via EPIDURAL
  Administered 2013-05-25: 3 mL via EPIDURAL

## 2013-05-24 MED ORDER — EPHEDRINE 5 MG/ML INJ: 10.0000 mg | INTRAVENOUS | Status: DC | PRN

## 2013-05-24 MED ORDER — TERBUTALINE SULFATE 1 MG/ML IJ SOLN: 0.2500 mg | Freq: Once | INTRAMUSCULAR | Status: AC | PRN

## 2013-05-24 MED ORDER — PHENYLEPHRINE 40 MCG/ML (10ML) SYRINGE FOR IV PUSH (FOR BLOOD PRESSURE SUPPORT): 80.0000 ug | PREFILLED_SYRINGE | INTRAVENOUS | Status: DC | PRN

## 2013-05-24 MED ORDER — OXYTOCIN 40 UNITS IN LACTATED RINGERS INFUSION - SIMPLE MED
1.0000 m[IU]/min | INTRAVENOUS | Status: DC
  Filled 2013-05-24: qty 1000

## 2013-05-24 MED ORDER — CITRIC ACID-SODIUM CITRATE 334-500 MG/5ML PO SOLN
30.0000 mL | ORAL | Status: DC | PRN
  Administered 2013-05-25: 30 mL via ORAL
  Filled 2013-05-24: qty 15

## 2013-05-24 NOTE — MAU Note (Signed)
Back and abdominal pain for the last 20 minutes. Lost mucus plug yesterday. Denies vaginal bleeding and leaking of fluid.

## 2013-05-24 NOTE — Progress Notes (Signed)
EFM off per anesthesia

## 2013-05-24 NOTE — Anesthesia Procedure Notes (Signed)
Epidural Patient location during procedure: OB  Preanesthetic Checklist Completed: patient identified, site marked, surgical consent, pre-op evaluation, timeout performed, IV checked, risks and benefits discussed and monitors and equipment checked  Epidural Patient position: sitting Prep: site prepped and draped and DuraPrep Patient monitoring: continuous pulse ox and blood pressure Approach: midline Injection technique: LOR air  Needle:  Needle type: Tuohy  Needle gauge: 17 G Needle length: 9 cm and 9 Needle insertion depth: 5 cm cm Catheter type: closed end flexible Catheter size: 19 Gauge Catheter at skin depth: 10 cm Test dose: negative  Assessment Events: blood not aspirated, injection not painful, no injection resistance, negative IV test and no paresthesia  Additional Notes Dosing of Epidural:  1st dose, through catheter ............................................. epi 1:200K + Xylocaine 40 mg  2nd dose, through catheter, after waiting 3 minutes.....epi 1:200K + Xylocaine 40 mg   ( 2% Xylo charted as a single dose in Epic Meds for ease of charting; actual dosing was fractionated as above, for saftey's sake)  As each dose occurred, patient was free of IV sx; and patient exhibited no evidence of SA injection.  Patient is more comfortable after epidural dosed. Please see RN's note for documentation of vital signs,and FHR which are stable.  Patient reminded not to try to ambulate with numb legs, and that an RN must be present the 1st time she attempts to get up.    

## 2013-05-24 NOTE — Progress Notes (Signed)
  Subjective: Pt resting comfortably with her epidural though feeling back discomfort with UCs.  Pt also reports some rectal pressure with UCs.  Objective: BP 133/77  Pulse 126  Temp(Src) 98.5 F (36.9 C) (Oral)  Resp 20  Ht 4\' 11"  (1.499 m)  Wt 145 lb (65.772 kg)  BMI 29.27 kg/m2  SpO2 99%  LMP 08/11/2012 I/O last 3 completed shifts: In: -  Out: 50 [Urine:50]    FHT:  Cat II UC:   regular, every 3-4 minutes  SVE:   Dilation: Lip/rim Effacement (%): 100 Station: 0;+1 Exam by:: J.Joory Gough,CNM Lip in right side  Assessment / Plan:  Labor: Active labor Preeclampsia: no s/s Fetal Wellbeing: Cat II Pain Control: Epidural I/D: GBS neg; SROM at 1515; afibrile Anticipated MOD: SVD   Jaid Quirion 05/24/2013, 8:28 PM

## 2013-05-24 NOTE — Progress Notes (Signed)
Pt vomiting and FHR deceling to 60's.Marland Kitchen  SVE 5cm/100/0. SROM light meconium, position changed IV bolus, FSE applied

## 2013-05-24 NOTE — Anesthesia Preprocedure Evaluation (Addendum)
Anesthesia Evaluation  Patient identified by MRN, date of birth, ID band Patient awake    Reviewed: Allergy & Precautions, H&P , Patient's Chart, lab work & pertinent test results  Airway Mallampati: II TM Distance: >3 FB Neck ROM: full    Dental  (+) Teeth Intact   Pulmonary  breath sounds clear to auscultation        Cardiovascular Rhythm:regular Rate:Normal     Neuro/Psych    GI/Hepatic   Endo/Other    Renal/GU      Musculoskeletal   Abdominal   Peds  Hematology   Anesthesia Other Findings       Reproductive/Obstetrics (+) Pregnancy (arrest of descent, NRFS --> C/S)                          Anesthesia Physical Anesthesia Plan  ASA: II and emergent  Anesthesia Plan: Epidural   Post-op Pain Management:    Induction:   Airway Management Planned:   Additional Equipment:   Intra-op Plan:   Post-operative Plan:   Informed Consent: I have reviewed the patients History and Physical, chart, labs and discussed the procedure including the risks, benefits and alternatives for the proposed anesthesia with the patient or authorized representative who has indicated his/her understanding and acceptance.   Dental Advisory Given  Plan Discussed with: Surgeon and CRNA  Anesthesia Plan Comments: (Labs checked- platelets confirmed with RN in room. Fetal heart tracing, per RN, reported to be stable enough for sitting procedure. Discussed epidural, and patient consents to the procedure:  included risk of possible headache,backache, failed block, allergic reaction, and nerve injury. This patient was asked if she had any questions or concerns before the procedure started. )      Anesthesia Quick Evaluation

## 2013-05-24 NOTE — H&P (Signed)
[name] is a 32 y.o. female, G2P0010 at [redacted]w[redacted]d, presenting for labor check.  UCs began about 0500, pt unable to time.  Denies VB, LOF, recent fever, resp or GI c/o's, UTI or PIH s/s. GFM. Desires epidural.  Patient Active Problem List   Diagnosis Date Noted  . History of cleft lip 10/25/2012  . Ultrasound scan abnormal 10/22/2012  . 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 H/o Hypokalemia 05/26/2012  . 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   EDC of 05/18/13 was established by LMP.   Anatomy scan:  17 weeks, with normal findings except an echogenic focus was noted in the left ventricle and an anterior placenta.   Additional [place] evaluations:  LV/EIF f/u at 26 weeks - faint EIF was seen in the LV.   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  She went through chemo finishing in Oct 2012  U/S at [facility] visit showed thickened nuchal region and they could not exclude early cystic hygoma    Past Medical History  Diagnosis Date  . Hydatidiform mole     hydatidiform dx 5/12  . Abnormal Pap smear 2012    Colpo done by AVS;Last pap 08/2012;was normal  . 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 Portacath placement    . Port-a-cath removal  06/07/2012  . Dilation and evacuation  2012    w/ AVS d/t molar pregnancy  . Cleft lip repair  1984   Family History: family history includes Hyperlipidemia in her mother. Social History:  reports that she has never smoked. She has never used smokeless tobacco.  She reports that she does not drink alcohol or use illicit drugs.   Prenatal Transfer Tool  Maternal Diabetes: No Genetic Screening: Normal Maternal Ultrasounds/Referrals: Abnormal:  Findings:   Isolated EIF (echogenic intracardiac focus) Fetal Ultrasounds or other Referrals:  Referred to Materal Fetal Medicine  Maternal Substance Abuse:  No Significant Maternal Medications:  None Significant Maternal Lab Results: Lab values include: Group B Strep negative    ROS: see HPI above, all other systems are negative  No Known Allergies    Blood pressure 134/71, pulse 100, temperature 98.1 F (36.7 C), temperature source Oral, resp. rate 20, height 4\' 11"  (1.499 m), weight 145 lb (65.772 kg), last menstrual period 08/11/2012, SpO2 99.00%.  Chest clear Heart RRR without murmur Abd gravid, NT Pelvic: 1 cm / 80% / -2 / vtx Ext: WNL  FHR: Reactive NST UCs:  Occasional  Prenatal labs: ABO, Rh: --/--/B POS (09/02 0850) Antibody: NEG (09/02 0850) Rubella:   Immune RPR: NON REACTIVE (09/02 0850)  HBsAg: NEGATIVE (01/28 1416)  HIV: NON REACTIVE (01/28 1416)  GBS: Negative (08/29 0000) Sickle cell/Hgb electrophoresis:  Slightly increased Hb-F, no clinical significance Pap:  Last pap 08/2012 GC:  neg Chlamydia:  neg Genetic screenings:  MSAFP - neg Glucola:  1 hour - 201; 3 hour -  85, 160, 178, 133 Other:  none    Assessment/Plan: IUP at [redacted]w[redacted]d Active labor GBS neg Desires epidural  Admit to BS per c/w Dr. Pennie Rushing as attending MD Routine [facility] admission orders Epidural prn   Rowan Blase, MSN 05/24/2013, 7:25 PM

## 2013-05-25 ENCOUNTER — Encounter (HOSPITAL_COMMUNITY)
Admission: AD | Disposition: A | Discharge status: Home / Self Care | Payer: Self-pay | Source: Ambulatory Visit | Attending: Obstetrics and Gynecology

## 2013-05-25 ENCOUNTER — Encounter (HOSPITAL_COMMUNITY): Payer: Self-pay | Admitting: Anesthesiology

## 2013-05-25 LAB — CBC
HCT: 28.3 % — ABNORMAL LOW (ref 36.0–46.0)
Hemoglobin: 9.3 g/dL — ABNORMAL LOW (ref 12.0–15.0)
MCH: 26 pg (ref 26.0–34.0)
MCV: 79.1 fL (ref 78.0–100.0)
RBC: 3.58 MIL/uL — ABNORMAL LOW (ref 3.87–5.11)

## 2013-05-25 LAB — COMPREHENSIVE METABOLIC PANEL
ALT: 20 U/L (ref 0–35)
AST: 26 U/L (ref 0–37)
Albumin: 2.3 g/dL — ABNORMAL LOW (ref 3.5–5.2)
Calcium: 9 mg/dL (ref 8.4–10.5)
Creatinine, Ser: 0.94 mg/dL (ref 0.50–1.10)
Sodium: 135 mEq/L (ref 135–145)

## 2013-05-25 SURGERY — Surgical Case
Anesthesia: Epidural | Site: Abdomen | Wound class: Clean Contaminated

## 2013-05-25 MED ORDER — LACTATED RINGERS IV SOLN
INTRAVENOUS | Status: DC
  Administered 2013-05-25: 17:00:00 via INTRAVENOUS

## 2013-05-25 MED ORDER — ZOLPIDEM TARTRATE 5 MG PO TABS: 5.0000 mg | ORAL_TABLET | Freq: Every evening | ORAL | Status: DC | PRN

## 2013-05-25 MED ORDER — METOCLOPRAMIDE HCL 5 MG/ML IJ SOLN
INTRAMUSCULAR | Status: AC
  Filled 2013-05-25: qty 2

## 2013-05-25 MED ORDER — METOCLOPRAMIDE HCL 5 MG/ML IJ SOLN: 10.0000 mg | Freq: Three times a day (TID) | INTRAMUSCULAR | Status: DC | PRN

## 2013-05-25 MED ORDER — METHYLERGONOVINE MALEATE 0.2 MG PO TABS: 0.2000 mg | ORAL_TABLET | ORAL | Status: DC | PRN

## 2013-05-25 MED ORDER — DIPHENHYDRAMINE HCL 50 MG/ML IJ SOLN: 12.5000 mg | INTRAMUSCULAR | Status: DC | PRN

## 2013-05-25 MED ORDER — PHENYLEPHRINE 40 MCG/ML (10ML) SYRINGE FOR IV PUSH (FOR BLOOD PRESSURE SUPPORT)
PREFILLED_SYRINGE | INTRAVENOUS | Status: AC
  Filled 2013-05-25: qty 5

## 2013-05-25 MED ORDER — FERROUS SULFATE 325 (65 FE) MG PO TABS
325.0000 mg | ORAL_TABLET | Freq: Two times a day (BID) | ORAL | Status: DC
  Administered 2013-05-26 – 2013-05-28 (×5): 325 mg via ORAL
  Filled 2013-05-25 (×5): qty 1

## 2013-05-25 MED ORDER — DIPHENHYDRAMINE HCL 50 MG/ML IJ SOLN: 25.0000 mg | INTRAMUSCULAR | Status: DC | PRN

## 2013-05-25 MED ORDER — PRENATAL MULTIVITAMIN CH
1.0000 | ORAL_TABLET | Freq: Every day | ORAL | Status: DC
  Administered 2013-05-25 – 2013-05-27 (×3): 1 via ORAL
  Filled 2013-05-25 (×3): qty 1

## 2013-05-25 MED ORDER — LACTATED RINGERS IV SOLN
INTRAVENOUS | Status: DC | PRN
  Administered 2013-05-25: 02:00:00 via INTRAVENOUS

## 2013-05-25 MED ORDER — MEPERIDINE HCL 25 MG/ML IJ SOLN
INTRAMUSCULAR | Status: DC | PRN
  Administered 2013-05-25: 25 mg via INTRAVENOUS

## 2013-05-25 MED ORDER — DEXAMETHASONE SODIUM PHOSPHATE 10 MG/ML IJ SOLN
INTRAMUSCULAR | Status: AC
  Filled 2013-05-25: qty 1

## 2013-05-25 MED ORDER — OXYTOCIN 10 UNIT/ML IJ SOLN
INTRAMUSCULAR | Status: AC
  Filled 2013-05-25: qty 4

## 2013-05-25 MED ORDER — DIPHENHYDRAMINE HCL 25 MG PO CAPS: 25.0000 mg | ORAL_CAPSULE | ORAL | Status: DC | PRN

## 2013-05-25 MED ORDER — DEXAMETHASONE SODIUM PHOSPHATE 10 MG/ML IJ SOLN
INTRAMUSCULAR | Status: DC | PRN
  Administered 2013-05-25: 10 mg via INTRAVENOUS

## 2013-05-25 MED ORDER — LACTATED RINGERS IV BOLUS (SEPSIS)
500.0000 mL | Freq: Once | INTRAVENOUS | Status: AC
  Administered 2013-05-25: 500 mL via INTRAVENOUS

## 2013-05-25 MED ORDER — SODIUM CHLORIDE 0.9 % IJ SOLN: 3.0000 mL | INTRAMUSCULAR | Status: DC | PRN

## 2013-05-25 MED ORDER — SENNOSIDES-DOCUSATE SODIUM 8.6-50 MG PO TABS
2.0000 | ORAL_TABLET | Freq: Every day | ORAL | Status: DC
  Administered 2013-05-25 – 2013-05-27 (×3): 2 via ORAL

## 2013-05-25 MED ORDER — KETOROLAC TROMETHAMINE 60 MG/2ML IM SOLN
INTRAMUSCULAR | Status: AC
  Filled 2013-05-25: qty 2

## 2013-05-25 MED ORDER — CEFAZOLIN SODIUM-DEXTROSE 2-3 GM-% IV SOLR
2.0000 g | Freq: Once | INTRAVENOUS | Status: AC
  Administered 2013-05-25: 2 g via INTRAVENOUS
  Filled 2013-05-25: qty 50

## 2013-05-25 MED ORDER — NALBUPHINE SYRINGE 5 MG/0.5 ML
5.0000 mg | INJECTION | INTRAMUSCULAR | Status: DC | PRN
  Filled 2013-05-25: qty 1

## 2013-05-25 MED ORDER — SIMETHICONE 80 MG PO CHEW: 80.0000 mg | CHEWABLE_TABLET | ORAL | Status: DC | PRN

## 2013-05-25 MED ORDER — FENTANYL CITRATE 0.05 MG/ML IJ SOLN: 25.0000 ug | INTRAMUSCULAR | Status: DC | PRN

## 2013-05-25 MED ORDER — MORPHINE SULFATE 0.5 MG/ML IJ SOLN
INTRAMUSCULAR | Status: AC
  Filled 2013-05-25: qty 10

## 2013-05-25 MED ORDER — DIBUCAINE 1 % RE OINT: 1.0000 "application " | TOPICAL_OINTMENT | RECTAL | Status: DC | PRN

## 2013-05-25 MED ORDER — LANOLIN HYDROUS EX OINT: 1.0000 "application " | TOPICAL_OINTMENT | CUTANEOUS | Status: DC | PRN

## 2013-05-25 MED ORDER — MEPERIDINE HCL 25 MG/ML IJ SOLN: 6.2500 mg | INTRAMUSCULAR | Status: DC | PRN

## 2013-05-25 MED ORDER — KETOROLAC TROMETHAMINE 60 MG/2ML IM SOLN
60.0000 mg | Freq: Once | INTRAMUSCULAR | Status: AC | PRN
  Administered 2013-05-25: 60 mg via INTRAMUSCULAR

## 2013-05-25 MED ORDER — ONDANSETRON HCL 4 MG PO TABS: 4.0000 mg | ORAL_TABLET | ORAL | Status: DC | PRN

## 2013-05-25 MED ORDER — METOCLOPRAMIDE HCL 5 MG/ML IJ SOLN
INTRAMUSCULAR | Status: DC | PRN
  Administered 2013-05-25: 10 mg via INTRAVENOUS

## 2013-05-25 MED ORDER — KETOROLAC TROMETHAMINE 30 MG/ML IJ SOLN: 30.0000 mg | Freq: Four times a day (QID) | INTRAMUSCULAR | Status: AC | PRN

## 2013-05-25 MED ORDER — MEPERIDINE HCL 25 MG/ML IJ SOLN
INTRAMUSCULAR | Status: AC
  Filled 2013-05-25: qty 1

## 2013-05-25 MED ORDER — NALOXONE HCL 1 MG/ML IJ SOLN
1.0000 ug/kg/h | INTRAMUSCULAR | Status: DC | PRN
  Filled 2013-05-25: qty 2

## 2013-05-25 MED ORDER — KETOROLAC TROMETHAMINE 30 MG/ML IJ SOLN
30.0000 mg | Freq: Four times a day (QID) | INTRAMUSCULAR | Status: AC | PRN
  Administered 2013-05-25: 30 mg via INTRAVENOUS
  Filled 2013-05-25: qty 1

## 2013-05-25 MED ORDER — IBUPROFEN 600 MG PO TABS
600.0000 mg | ORAL_TABLET | Freq: Four times a day (QID) | ORAL | Status: DC
  Administered 2013-05-25 – 2013-05-28 (×10): 600 mg via ORAL
  Filled 2013-05-25 (×11): qty 1

## 2013-05-25 MED ORDER — OXYTOCIN 40 UNITS IN LACTATED RINGERS INFUSION - SIMPLE MED: 62.5000 mL/h | INTRAVENOUS | Status: AC

## 2013-05-25 MED ORDER — NALOXONE HCL 0.4 MG/ML IJ SOLN: 0.4000 mg | INTRAMUSCULAR | Status: DC | PRN

## 2013-05-25 MED ORDER — SIMETHICONE 80 MG PO CHEW
80.0000 mg | CHEWABLE_TABLET | Freq: Three times a day (TID) | ORAL | Status: DC
  Administered 2013-05-25 – 2013-05-28 (×11): 80 mg via ORAL

## 2013-05-25 MED ORDER — METHYLERGONOVINE MALEATE 0.2 MG/ML IJ SOLN: 0.2000 mg | INTRAMUSCULAR | Status: DC | PRN

## 2013-05-25 MED ORDER — NALBUPHINE SYRINGE 5 MG/0.5 ML
5.0000 mg | INJECTION | INTRAMUSCULAR | Status: DC | PRN
Start: 2013-05-25 — End: 2013-05-28
  Filled 2013-05-25: qty 1

## 2013-05-25 MED ORDER — WITCH HAZEL-GLYCERIN EX PADS: 1.0000 "application " | MEDICATED_PAD | CUTANEOUS | Status: DC | PRN

## 2013-05-25 MED ORDER — OXYCODONE-ACETAMINOPHEN 5-325 MG PO TABS
1.0000 | ORAL_TABLET | ORAL | Status: DC | PRN
  Administered 2013-05-26 – 2013-05-27 (×3): 1 via ORAL
  Administered 2013-05-28 (×2): 2 via ORAL
  Filled 2013-05-25 (×2): qty 2
  Filled 2013-05-25 (×3): qty 1

## 2013-05-25 MED ORDER — METHYLERGONOVINE MALEATE 0.2 MG/ML IJ SOLN
INTRAMUSCULAR | Status: DC | PRN
  Administered 2013-05-25: .2 mg via INTRAVENOUS

## 2013-05-25 MED ORDER — ONDANSETRON HCL 4 MG/2ML IJ SOLN: 4.0000 mg | Freq: Three times a day (TID) | INTRAMUSCULAR | Status: DC | PRN

## 2013-05-25 MED ORDER — MENTHOL 3 MG MT LOZG: 1.0000 | LOZENGE | OROMUCOSAL | Status: DC | PRN

## 2013-05-25 MED ORDER — OXYTOCIN 10 UNIT/ML IJ SOLN
40.0000 [IU] | INTRAVENOUS | Status: DC | PRN
  Administered 2013-05-25: 40 [IU] via INTRAVENOUS

## 2013-05-25 MED ORDER — ONDANSETRON HCL 4 MG/2ML IJ SOLN: 4.0000 mg | INTRAMUSCULAR | Status: DC | PRN

## 2013-05-25 MED ORDER — SCOPOLAMINE 1 MG/3DAYS TD PT72
MEDICATED_PATCH | TRANSDERMAL | Status: AC
  Filled 2013-05-25: qty 1

## 2013-05-25 MED ORDER — MORPHINE SULFATE (PF) 0.5 MG/ML IJ SOLN
INTRAMUSCULAR | Status: DC | PRN
  Administered 2013-05-25: 3 mg via EPIDURAL

## 2013-05-25 MED ORDER — ONDANSETRON HCL 4 MG/2ML IJ SOLN
INTRAMUSCULAR | Status: DC | PRN
  Administered 2013-05-25: 4 mg via INTRAVENOUS

## 2013-05-25 MED ORDER — DIPHENHYDRAMINE HCL 25 MG PO CAPS: 25.0000 mg | ORAL_CAPSULE | Freq: Four times a day (QID) | ORAL | Status: DC | PRN

## 2013-05-25 MED ORDER — SCOPOLAMINE 1 MG/3DAYS TD PT72
1.0000 | MEDICATED_PATCH | Freq: Once | TRANSDERMAL | Status: AC
  Administered 2013-05-25: 1.5 mg via TRANSDERMAL

## 2013-05-25 SURGICAL SUPPLY — 37 items
BARRIER ADHS 3X4 INTERCEED (GAUZE/BANDAGES/DRESSINGS) IMPLANT
BENZOIN TINCTURE PRP APPL 2/3 (GAUZE/BANDAGES/DRESSINGS) ×2 IMPLANT
CLAMP CORD UMBIL (MISCELLANEOUS) IMPLANT
CLOTH BEACON ORANGE TIMEOUT ST (SAFETY) ×2 IMPLANT
CONTAINER PREFILL 10% NBF 15ML (MISCELLANEOUS) IMPLANT
DRAPE LG THREE QUARTER DISP (DRAPES) ×2 IMPLANT
DRSG OPSITE POSTOP 4X10 (GAUZE/BANDAGES/DRESSINGS) ×2 IMPLANT
DURAPREP 26ML APPLICATOR (WOUND CARE) ×2 IMPLANT
ELECT REM PT RETURN 9FT ADLT (ELECTROSURGICAL) ×2
ELECTRODE REM PT RTRN 9FT ADLT (ELECTROSURGICAL) ×1 IMPLANT
EXTRACTOR VACUUM M CUP 4 TUBE (SUCTIONS) IMPLANT
GLOVE BIOGEL M 6.5 STRL (GLOVE) ×4 IMPLANT
GLOVE BIOGEL PI IND STRL 6.5 (GLOVE) ×1 IMPLANT
GLOVE BIOGEL PI INDICATOR 6.5 (GLOVE) ×1
GOWN PREVENTION PLUS XLARGE (GOWN DISPOSABLE) ×2 IMPLANT
GOWN STRL REIN XL XLG (GOWN DISPOSABLE) ×4 IMPLANT
KIT ABG SYR 3ML LUER SLIP (SYRINGE) ×2 IMPLANT
NEEDLE HYPO 25X5/8 SAFETYGLIDE (NEEDLE) ×2 IMPLANT
NS IRRIG 1000ML POUR BTL (IV SOLUTION) ×2 IMPLANT
PACK C SECTION WH (CUSTOM PROCEDURE TRAY) ×2 IMPLANT
PAD OB MATERNITY 4.3X12.25 (PERSONAL CARE ITEMS) ×2 IMPLANT
RTRCTR C-SECT PINK 25CM LRG (MISCELLANEOUS) ×2 IMPLANT
RTRCTR C-SECT PINK 34CM XLRG (MISCELLANEOUS) IMPLANT
STAPLER VISISTAT 35W (STAPLE) IMPLANT
STRIP CLOSURE SKIN 1/2X4 (GAUZE/BANDAGES/DRESSINGS) ×2 IMPLANT
SUT PDS AB 0 CT1 27 (SUTURE) ×4 IMPLANT
SUT PLAIN 0 NONE (SUTURE) IMPLANT
SUT VIC AB 0 CTX 36 (SUTURE) ×3
SUT VIC AB 0 CTX36XBRD ANBCTRL (SUTURE) ×3 IMPLANT
SUT VIC AB 2-0 CT1 27 (SUTURE) ×2
SUT VIC AB 2-0 CT1 TAPERPNT 27 (SUTURE) ×2 IMPLANT
SUT VIC AB 3-0 SH 27 (SUTURE)
SUT VIC AB 3-0 SH 27X BRD (SUTURE) IMPLANT
SUT VIC AB 4-0 KS 27 (SUTURE) ×2 IMPLANT
TOWEL OR 17X24 6PK STRL BLUE (TOWEL DISPOSABLE) ×2 IMPLANT
TRAY FOLEY CATH 14FR (SET/KITS/TRAYS/PACK) ×2 IMPLANT
WATER STERILE IRR 1000ML POUR (IV SOLUTION) ×2 IMPLANT

## 2013-05-25 NOTE — Op Note (Signed)
Cesarean Section Procedure Note  Indications: failure to progress: arrest of descent and non-reassuring fetal status  Pre-operative Diagnosis: 41 week 0 day pregnancy.  Post-operative Diagnosis: same  Surgeon: Jessee Avers.   Assistants: Haroldine Laws CMW  Anesthesia: Epidural anesthesia  ASA Class: 2   Procedure Details   The patient was seen in the Holding Room. The risks, benefits, complications, treatment options, and expected outcomes were discussed with the patient.  The patient concurred with the proposed plan, giving informed consent.  The site of surgery properly noted/marked. The patient was taken to Operating Room # 2, identified as [name] and the procedure verified as C-Section Delivery. A Time Out was held and the above information confirmed.  After induction of anesthesia, the patient was draped and prepped in the usual sterile manner. A Pfannenstiel incision was made and carried down through the subcutaneous tissue to the fascia. Fascial incision was made and extended transversely. The fascia was separated from the underlying rectus tissue superiorly and inferiorly. The peritoneum was identified and entered. Peritoneal incision was extended longitudinally. The utero-vesical peritoneal reflection was incised transversely and the bladder flap was bluntly freed from the lower uterine segment. A low transverse uterine incision was made. Delivered from cephalic presentation was a  Female with Apgar scores of 8 at one minute and 9 at five minutes. After the umbilical cord was clamped and cut cord blood was obtained for evaluation. The placenta was removed intact and appeared normal. The uterine outline, tubes and ovaries appeared normal. The uterine incision was closed with running locked sutures of 0 vicryl a second layer of 0 vicryl was used to imbricate the incision. interseen was placed along the incision . Hemostasis was observed. Lavage was carried out until clear. The fascia  was then reapproximated with running sutures of 0 pds. The skin was reapproximated with 4-0 vicryl.  Instrument, sponge, and needle counts were correct prior the abdominal closure and at the conclusion of the case.   Findings: Female infant in the cephalic presentation with thick meconium.. Normal fallopian tubes and ovaries   Estimated Blood Loss:  600 ml         Drains: foley           Total IV Fluids:  Per anesthesia ml         Specimens: Placenta and Disposition:  Sent to Pathology          Implants: none         Complications:  None; patient tolerated the procedure well.         Disposition: PACU - hemodynamically stable.         Condition: stable  Attending Attestation: I performed the procedure.

## 2013-05-25 NOTE — Lactation Note (Signed)
This note was copied from the chart of Jillian Syrina Zappia. Lactation Consultation Note Initial consultation; mom states she wants to breast feed but has been too tired, so has been giving bottles of formula. Mom states she has not attempted to latch baby. Reinforced to mom the importance of early and frequent breast feeding and STS. Enc mom to latch baby as soon as she can (baby is out of the room at this time for lab work per mom). Baby recently had 10 mL formula per mom. Enc mom to call for help when baby is ready to feed again.  Lactation brochure provided, mom made aware of lactation support and community resources.  Mom states she does not have any questions or concerns at this time.   Patient Name: [name] Today's Date: 05/25/2013 Reason for consult: Initial assessment   Maternal Data Formula Feeding for Exclusion: Yes Reason for exclusion: Mother's choice to formula and breast feed on admission Does the patient have breastfeeding experience prior to this delivery?: No  Feeding Feeding Type: Formula Nipple Type: Regular  LATCH Score/Interventions                      Lactation Tools Discussed/Used     Consult Status Consult Status: PRN    Lenard Forth 05/25/2013, 3:28 PM

## 2013-05-25 NOTE — Progress Notes (Signed)
[name] is a 32 y.o. G2P0010 at [redacted]w[redacted]d   Subjective: In to assess patient due to report of late decelerations. Upon arrival fhr is 140's with moderate variability variable decelerations with contractions and pushing. Accelerations noted. Pt comfortabe with epidural    Objective: BP 118/81  Pulse 84  Temp(Src) 98.6 F (37 C) (Oral)  Resp 20  Ht 4\' 11"  (1.499 m)  Wt 65.772 kg (145 lb)  BMI 29.27 kg/m2  SpO2 99%  LMP 08/11/2012 I/O last 3 completed shifts: In: -  Out: 50 [Urine:50] Total I/O In: -  Out: 125 [Urine:125]  FHT:  FHR: 140 bpm, variability: moderate,  accelerations:  Present,  decelerations:  Present variable decelerations to the 90''s no late decelerations at this time.  and   UC:   regular, every 2-3 minutes SVE:   Dilation: 10 Effacement (%): 100 Station: +1 Exam by:: LCarpenter,RN  Labs: Lab Results  Component Value Date   WBC 20.2* 05/24/2013   HGB 12.4 05/24/2013   HCT 37.5 05/24/2013   MCV 78.5 05/24/2013   PLT 214 05/24/2013    Assessment / Plan: Spontaneous labor, progressing normally  Labor: Progressing normally Preeclampsia:  decreased urine output bp has been normal plan to order cmp and monitor closely  Fetal Wellbeing:  Category I Pain Control:  Epidural I/D:  n/a Anticipated MOD:  will proceed with attempted vaginal delivery given reassuring heart rate currently.. I discussed with the patient need for cesarean section in the event that the fhr becomes nonreassuring i.. risk to include but not limited to infection, bleeding damage to bowel bladder baby with need for further surgery. r/o transfusion HIV/ Hep B&C discussed. pt voiced understanding and is agreeable to plan of care outlined above.   Jerod Mcquain J. 05/25/2013, 12:30 AM

## 2013-05-25 NOTE — Progress Notes (Signed)
Pt dizzy when attempted to ambulate, standing heart rate 120, sitting heart rate 98. Dr. Normand Sloop notified, new order received for a LR bolus of 500. Will continue to monitor.

## 2013-05-25 NOTE — Anesthesia Postprocedure Evaluation (Signed)
  Anesthesia Post-op Note  Patient: [name]  Procedure(s) Performed: Procedure(s): CESAREAN SECTION (N/A)  Patient Location: Mother/Baby  Anesthesia Type:Epidural  Level of Consciousness: awake  Airway and Oxygen Therapy: Patient Spontanous Breathing  Post-op Pain: mild  Post-op Assessment: Patient's Cardiovascular Status Stable and Respiratory Function Stable  Post-op Vital Signs: stable  Complications: No apparent anesthesia complications

## 2013-05-25 NOTE — Progress Notes (Signed)
In to assess patient she has begun to have late decelerations  To the 90's with slow return to baseline. Moderate variability no accelerations.. Recommend cesarean section due to nonreassuring fhr. Pt is in agreement. R/b/a were discussed. Will proceed with cesarean section.

## 2013-05-25 NOTE — Progress Notes (Signed)
Subjective: Postpartum Day 0: Cesarean Delivery Patient reports tolerating PO.   Feels dizzy with standding   Objective: Vital signs in last 24 hours: Temp:  [97.9 F (36.6 C)-99 F (37.2 C)] 98.5 F (36.9 C) (09/03 1100) Pulse Rate:  [84-139] 120 (09/03 1105) Resp:  [18-26] 18 (09/03 1100) BP: (98-134)/(56-96) 105/60 mmHg (09/03 1105) SpO2:  [95 %-100 %] 97 % (09/03 1100)  Physical Exam:  General: alert and cooperative Lochia: appropriate Uterine Fundus: firm Incision: no significant drainage DVT Evaluation: No evidence of DVT seen on physical exam.   Recent Labs  05/24/13 0850 05/25/13 0640  HGB 12.4 9.3*  HCT 37.5 28.3*    Assessment/Plan: Status post Cesarean section. Pt with increase of pulse and dizziness with standing.  will give iv bolus then try again with ambulation  Continue current care.  [name] A 05/25/2013, 12:49 PM

## 2013-05-25 NOTE — Anesthesia Postprocedure Evaluation (Signed)
  Anesthesia Post-op Note  Anesthesia Post Note  Patient: [name]  Procedure(s) Performed: Procedure(s) (LRB): CESAREAN SECTION (N/A)  Anesthesia type: Epidural  Patient location: PACU  Post pain: Pain level controlled  Post assessment: Post-op Vital signs reviewed  Last Vitals:  Filed Vitals:   05/25/13 0345  BP:   Pulse: 101  Temp:   Resp: 22    Post vital signs: stable  Level of consciousness: awake  Complications: No apparent anesthesia complications

## 2013-05-25 NOTE — Progress Notes (Signed)
Attempted to ambulate patient, she took 3 steps and became dizzy. Pt assisted back to bed, encouraged her to drink more fluids and eat some dinner. Will continue to monitor.

## 2013-05-25 NOTE — Transfer of Care (Signed)
Immediate Anesthesia Transfer of Care Note  Patient: [name]  Procedure(s) Performed: Procedure(s): CESAREAN SECTION (N/A)  Patient Location: PACU  Anesthesia Type:Epidural  Level of Consciousness: awake, alert , oriented and patient cooperative  Airway & Oxygen Therapy: Patient Spontanous Breathing  Post-op Assessment: Report given to PACU RN and Post -op Vital signs reviewed and stable  Post vital signs: Reviewed and stable  Complications: No apparent anesthesia complications

## 2013-05-26 ENCOUNTER — Encounter (HOSPITAL_COMMUNITY): Payer: Self-pay | Admitting: Obstetrics and Gynecology

## 2013-05-26 NOTE — Progress Notes (Signed)
Subjective: Postpartum Day 1`: Cesarean Delivery Patient reports incisional pain, tolerating PO and no problems voiding.    Objective: Vital signs in last 24 hours: Temp:  [98 F (36.7 C)-98.6 F (37 C)] 98 F (36.7 C) (09/04 0639) Pulse Rate:  [97-123] 118 (09/04 0639) Resp:  [16-18] 16 (09/04 0639) BP: (92-105)/(50-64) 92/62 mmHg (09/04 0639) SpO2:  [97 %-98 %] 98 % (09/04 0347)  Physical Exam:  General: alert and cooperative Lochia: appropriate Uterine Fundus: firm Incision: no significant drainage DVT Evaluation: No evidence of DVT seen on physical exam.   Recent Labs  05/24/13 0850 05/25/13 0640  HGB 12.4 9.3*  HCT 37.5 28.3*    Assessment/Plan: Status post Cesarean section. Doing well postoperatively.  Continue current care.  Analuisa Tudor A 05/26/2013, 10:56 AM

## 2013-05-27 ENCOUNTER — Inpatient Hospital Stay (HOSPITAL_COMMUNITY): Admission: RE | Admit: 2013-05-27 | Payer: BC Managed Care – PPO | Source: Ambulatory Visit

## 2013-05-27 NOTE — Progress Notes (Signed)
Subjective: Postpartum Day 2 Cesarean Delivery Patient reports no nausea, vomiting.   incisional pain relieved by pain meds.  Tolerating PO and no problems voiding.    Objective: Vital signs in last 24 hours: Temp:  [97.8 F (36.6 C)-98.7 F (37.1 C)] 97.8 F (36.6 C) (09/05 1610) Pulse Rate:  [101-108] 108 (09/05 0632) Resp:  [18] 18 (09/05 9604) BP: (99-109)/(59-74) 109/74 mmHg (09/05 5409)  Physical Exam:  General: alert and no distress Lochia: appropriate Uterine Fundus: firm Incision: healing well DVT Evaluation: No evidence of DVT seen on physical exam.   Recent Labs  05/25/13 0640  HGB 9.3*  HCT 28.3*    Assessment/Plan: Status post Cesarean section. Doing well postoperatively.  Continue current care.  [name] P 05/27/2013, 8:57 AM

## 2013-05-28 DIAGNOSIS — Z98891 History of uterine scar from previous surgery: Secondary | ICD-10-CM

## 2013-05-28 MED ORDER — IBUPROFEN 600 MG PO TABS: 600.0000 mg | ORAL_TABLET | Freq: Four times a day (QID) | ORAL | Status: AC | PRN

## 2013-05-28 MED ORDER — OXYCODONE-ACETAMINOPHEN 5-325 MG PO TABS: 1.0000 | ORAL_TABLET | ORAL | Status: DC | PRN

## 2013-05-28 NOTE — Discharge Summary (Signed)
  Cesarean Section Delivery Discharge Summary  [name]  DOB:    [birth year] MRN:    [id number] CSN:    [id number]  Date of admission:                  05/24/13  Date of discharge:                   05/28/13  Procedures this admission:  Date of Delivery:   Newborn Data:  Live born female  Birth Weight: 7 lb 1.9 oz (3229 g) APGAR: 8, 9  Home with mother.   History of Present Illness:  [name] is a 32 y.o. female, G2P1011, who presents at [redacted]w[redacted]d weeks gestation. The patient has been followed at the [facility] Gy[facility]cology division of [facility].    Her pregnancy has been complicated by:  Patient Active Problem List   Diagnosis Date Noted  . History of cleft lip 10/25/2012  . Ultrasound scan abnormal 10/22/2012  . 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 H/o Hypokalemia 05/26/2012  . 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  Her labor was complicated by failure to descend and non-reassuring FHR, and she was delivered by primary LTCS by Dr. Richardson Dopp. Her postpartum course was not complicated. She was discharged to home on postpartum day 3 doing well.  Feeding:  breast  Contraception:  Declines at present  Discharge hemoglobin:  HGB  Date Value Range Status  12/06/2012 12.1  11.6 - 15.9 g/dL Final     Hemoglobin  Date Value Range Status  05/25/2013 9.3* 12.0 - 15.0 g/dL Final     DELTA CHECK NOTED     REPEATED TO VERIFY     HCT  Date Value Range Status  05/25/2013 28.3* 36.0 - 46.0 % Final  12/06/2012 36.1  34.8 - 46.6 % Final    Discharge Physical Exam:   General: alert Lochia: appropriate Uterine Fundus: firm Incision: healing well, with honeycomb dressing in place. DVT Evaluation: No evidence of DVT seen on physical exam. Negative Homan's sign.  Intrapartum Procedures: cesarean: low cervical, transverse due to failure to  descend and non-reassuring FHR status by Dr. Richardson Dopp Postpartum Procedures: none Complications-Operative and Postpartum: none  Discharge Diagnoses: Term Pregnancy-delivered  Discharge Information:  Activity:           Per [facility] handout Diet:                routine Medications: Ibuprofen and Percocet Condition:      stable Instructions:  refer to practice specific booklet Discharge to: home     Nigel Bridgeman 05/28/2013

## 2013-07-04 ENCOUNTER — Other Ambulatory Visit (HOSPITAL_BASED_OUTPATIENT_CLINIC_OR_DEPARTMENT_OTHER): Payer: BC Managed Care – PPO | Admitting: Lab

## 2013-07-04 DIAGNOSIS — O020 Blighted ovum and nonhydatidiform mole: Secondary | ICD-10-CM

## 2013-07-04 DIAGNOSIS — O0289 Other abnormal products of conception: Secondary | ICD-10-CM

## 2013-07-04 DIAGNOSIS — O019 Hydatidiform mole, unspecified: Secondary | ICD-10-CM

## 2013-07-04 LAB — CBC WITH DIFFERENTIAL/PLATELET
BASO%: 0.3 % (ref 0.0–2.0)
HCT: 35.2 % (ref 34.8–46.6)
LYMPH%: 25.4 % (ref 14.0–49.7)
MCH: 24.2 pg — ABNORMAL LOW (ref 25.1–34.0)
MCHC: 32.2 g/dL (ref 31.5–36.0)
MCV: 75.1 fL — ABNORMAL LOW (ref 79.5–101.0)
MONO#: 0.5 10*3/uL (ref 0.1–0.9)
MONO%: 7.1 % (ref 0.0–14.0)
NEUT%: 64.2 % (ref 38.4–76.8)
Platelets: 211 10*3/uL (ref 145–400)
WBC: 6.9 10*3/uL (ref 3.9–10.3)

## 2013-07-04 LAB — COMPREHENSIVE METABOLIC PANEL (CC13)
ALT: 89 U/L — ABNORMAL HIGH (ref 0–55)
Anion Gap: 10 mEq/L (ref 3–11)
CO2: 21 mEq/L — ABNORMAL LOW (ref 22–29)
Creatinine: 0.7 mg/dL (ref 0.6–1.1)
Total Bilirubin: 0.4 mg/dL (ref 0.20–1.20)

## 2013-07-06 LAB — BETA HCG QUANT (REF LAB): Beta hCG, Tumor Marker: 1.1 m[IU]/mL (ref <5.0)

## 2013-07-07 ENCOUNTER — Ambulatory Visit (HOSPITAL_BASED_OUTPATIENT_CLINIC_OR_DEPARTMENT_OTHER): Payer: BC Managed Care – PPO | Admitting: Physician Assistant

## 2013-07-07 ENCOUNTER — Telehealth: Payer: Self-pay | Admitting: *Deleted

## 2013-07-07 ENCOUNTER — Encounter: Payer: Self-pay | Admitting: Physician Assistant

## 2013-07-07 VITALS — BP 115/75 | HR 114 | Temp 98.6°F | Resp 20 | Ht 59.0 in | Wt 122.9 lb

## 2013-07-07 DIAGNOSIS — E876 Hypokalemia: Secondary | ICD-10-CM

## 2013-07-07 DIAGNOSIS — C787 Secondary malignant neoplasm of liver and intrahepatic bile duct: Secondary | ICD-10-CM

## 2013-07-07 DIAGNOSIS — O020 Blighted ovum and nonhydatidiform mole: Secondary | ICD-10-CM

## 2013-07-07 DIAGNOSIS — C801 Malignant (primary) neoplasm, unspecified: Secondary | ICD-10-CM

## 2013-07-07 DIAGNOSIS — D649 Anemia, unspecified: Secondary | ICD-10-CM

## 2013-07-07 DIAGNOSIS — R748 Abnormal levels of other serum enzymes: Secondary | ICD-10-CM | POA: Insufficient documentation

## 2013-07-07 NOTE — Progress Notes (Signed)
ID: [name]   DOB: 07/25/1981  MR#: [id number]  CSN#:[id number]  PCP: Pcp Not In System GYN: Leonard Schwartz OTHER:  CHIEF COMPLAINT:  Hx Molar Pregnancy   HISTORY OF PRESENT ILLNESS: the patient was followed at [facility] and found to have a molar pregnancy.  On 01/23/2011, she had a 1st trimester dilatation and evacuation with the pathology ([id number]) showing an 11-week molar pregnancy.  The preoperative beta-HCG was 261,077.  Repeat 2 weeks postop was down to 88,955, however, repeat May 23rd was 191,584 and on May 26th 236,711.    At that point, the patient was referred here for further evaluation and treatment. Complete treatment history is detailed below  INTERVAL HISTORY:   [name] returns today for routine followup of her history of molar pregnancy. The interval history is significant for her having delivered a healthy baby girl by C-section on 05/25/2013. The baby is now 67 weeks old and is called  Scientific laboratory technician.  [name] tells me they're all doing well, and are just "a little tired". She is planning to go back to work next week at  [employer].  A metabolic panel drawn 2 days ago did show some elevation in her liver enzymes with an AST of 79 and an ALT of 89. She tells me she takes only an occasional Tylenol, no more than 1 or 2 tablets a day. She is no longer taking oxycodone/APAP. She also denies drinking any alcoholic beverages and is utilizing no herbs or supplements, only her prenatal vitamin.  To her knowledge, she has had no exposure to hepatitis.  REVIEW OF SYSTEMS:  [name] denies any recent fevers or chills, and overall is feeling very well. She continues to have some intermittent vaginal bleeding. She's had no additional abnormal bleeding elsewhere. She's eating and drinking well, and keeping herself well hydrated. She is breast-feeding. She's had no significant nausea, and no change in bowel or bladder habits. She's had no cough, increased shortness of breath, chest  pain, palpitations, or abdominal pain. She has some occasional headaches which she tells me are "normal", and she denies any dizziness or change in vision. She has some chronic low back pain which is stable. She denies any additional pain elsewhere, and has had no peripheral swelling.  A detailed review of systems is otherwise stable and noncontributory.   PAST MEDICAL HISTORY: Past Medical History  Diagnosis Date  . Hydatidiform mole     hydatidiform dx 5/12  . Abnormal Pap smear 2012    Colpo done by AVS;Last pap 08/2012;was normal  . 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  The patient's mother is alive at age 38.  The patient has 1 brother, no sisters.  GYNECOLOGIC HISTORY: (Updated 07/07/2013)  GX P1. Healthy baby girl delivered by C-section in September 2014 when Terrin was 32.   SOCIAL HISTORY:  (updated 07/07/2013) The patient is an immigrant from [place] and currently works at [employer] Monday through Saturday.  She usually starts work between 10 and 11 in the morning.  Her husband whose name is [name] (goes by [name]) also has immigrated from [place] and has a job "[employer]   they have a daughter, born September 2014, name [name]. At home, in addition to the patient, her husband, and their daughter, are her parents, her brother, and her brother's wife and child.  ADVANCED DIRECTIVES:  HEALTH MAINTENANCE: (Updated 07/07/2013) History  Substance Use Topics  . Smoking status: Never Smoker   . Smokeless tobacco: Never Used  . Alcohol Use: No     Colonoscopy: Never  PAP: UTD, [facility]  Bone density: Never  Lipid panel:   No Known Allergies  Current Outpatient Prescriptions  Medication Sig Dispense Refill  . acetaminophen (TYLENOL) 325 MG tablet Take 325 mg by mouth every 6 (six) hours as needed  for pain (headache).      Marland Kitchen ibuprofen (ADVIL,MOTRIN) 600 MG tablet Take 1 tablet (600 mg total) by mouth every 6 (six) hours as needed for pain.  36 tablet  2  . Prenatal Vit-Fe Fumarate-FA (PRENATAL MULTIVITAMIN) TABS tablet Take 1 tablet by mouth daily at 12 noon.       No current facility-administered medications for this visit.    OBJECTIVE: young [place] Asian woman who appears well Filed Vitals:   07/07/13 0914  BP: 115/75  Pulse: 114  Temp: 98.6 F (37 C)  Resp: 20     Body mass index is 24.81 kg/(m^2).    ECOG FS:0 Filed Weights   07/07/13 0914  Weight: 122 lb 14.4 oz (55.747 kg)   Physical Exam: HEENT:  Sclerae anicteric.  Oropharynx clear.  NODES:  No cervical, supraclavicular, or axillary lymphadenopathy palpated.  BREAST EXAM:  Deferred. LUNGS:  Clear to auscultation bilaterally.  No wheezes or rhonchi HEART:  Regular rate and rhythm. No murmur appreciated ABDOMEN:  Soft, nontender.  No organomegaly. Positive bowel sounds.  MSK:  No focal spinal tenderness to palpation.  EXTREMITIES:  No peripheral edema.   NEURO:  Nonfocal. Well oriented.  Pleasant affect.   LAB RESULTS:  Lab Results  Component Value Date   WBC 6.9 07/04/2013   NEUTROABS 4.4 07/04/2013   HGB 11.3* 07/04/2013   HCT 35.2 07/04/2013   MCV 75.1* 07/04/2013   PLT 211 07/04/2013      Chemistry      Component Value Date/Time   NA 142 07/04/2013 0918   NA 135 05/25/2013 0045   K 3.3* 07/04/2013 0918   K 3.9 05/25/2013 0045   CL 98 05/25/2013 0045   CL 107 12/06/2012 0851   CO2 21* 07/04/2013 0918   CO2 17* 05/25/2013 0045   BUN 12.0 07/04/2013 0918   BUN 12 05/25/2013 0045   CREATININE 0.7 07/04/2013 0918   CREATININE 0.94 05/25/2013 0045      Component Value Date/Time   CALCIUM 9.6 07/04/2013 0918   CALCIUM 9.0 05/25/2013 0045   ALKPHOS 118 07/04/2013 0918   ALKPHOS 172* 05/25/2013 0045   AST 79* 07/04/2013 0918   AST 26 05/25/2013 0045   ALT 89* 07/04/2013 0918   ALT 20 05/25/2013 0045   BILITOT  0.40 07/04/2013 0918   BILITOT 0.4 05/25/2013 0045     Serum quantitative hCG  Was normal at 1.1 on 07/04/2013.  STUDIES: No results found.   ASSESSMENT:  32 y.o.   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  (3) Status post delivery of a healthy baby girl by C-section September 2014   (4)  Anemia  (5)  Hypokalemia  (6)  Elevated  liver enzymes    PLAN:  Kaziah  appears to be doing very well overall. Her hemoglobin is recovering since her delivery. Her potassium is slightly low at 3.3 and I have advised her to increase her intake of potassium rich foods. She would like to avoid an oral supplement if possible.  Dr. Darnelle Catalan has also reviewed her labs, it is suggested that we repeat labs in approximately 3-4 weeks to follow the increase in liver enzymes. We will also obtain a hepatitis panel at that time as well. If all is normal, we will simply resume regular followup and we'll plan on seeing her again in February 2015 with labs and physical exam. If she is still doing well at that point, we will likely begin seeing her annually until she completes a total of 5 years of followup. Of course she will continue to be followed closely by her GYN office.  This was reviewed in detail with the patient today, and she was given this information in writing. She voices understanding and agreement, and will call with any changes or problems.  Daviel Allegretto PA-C  07/07/2013

## 2013-07-07 NOTE — Telephone Encounter (Signed)
appts made and printed...td 

## 2013-07-28 ENCOUNTER — Other Ambulatory Visit: Payer: Self-pay

## 2013-08-01 ENCOUNTER — Other Ambulatory Visit: Payer: BC Managed Care – PPO | Admitting: Lab

## 2013-08-01 ENCOUNTER — Encounter: Payer: Self-pay | Admitting: Physician Assistant

## 2013-08-01 DIAGNOSIS — R748 Abnormal levels of other serum enzymes: Secondary | ICD-10-CM

## 2013-08-01 DIAGNOSIS — C7800 Secondary malignant neoplasm of unspecified lung: Secondary | ICD-10-CM

## 2013-08-01 DIAGNOSIS — D649 Anemia, unspecified: Secondary | ICD-10-CM

## 2013-08-01 DIAGNOSIS — O020 Blighted ovum and nonhydatidiform mole: Secondary | ICD-10-CM

## 2013-08-01 LAB — COMPREHENSIVE METABOLIC PANEL (CC13)
ALT: 50 U/L (ref 0–55)
AST: 57 U/L — ABNORMAL HIGH (ref 5–34)
Alkaline Phosphatase: 123 U/L (ref 40–150)
Anion Gap: 11 mEq/L (ref 3–11)
CO2: 19 mEq/L — ABNORMAL LOW (ref 22–29)
Creatinine: 0.5 mg/dL — ABNORMAL LOW (ref 0.6–1.1)
Glucose: 101 mg/dl (ref 70–140)
Potassium: 3.4 mEq/L — ABNORMAL LOW (ref 3.5–5.1)
Sodium: 141 mEq/L (ref 136–145)
Total Bilirubin: 0.92 mg/dL (ref 0.20–1.20)
Total Protein: 6.6 g/dL (ref 6.4–8.3)

## 2013-08-01 LAB — HEPATITIS PANEL, ACUTE
HCV Ab: NEGATIVE
Hep A IgM: NONREACTIVE
Hep B C IgM: NONREACTIVE
Hepatitis B Surface Ag: NEGATIVE

## 2013-08-01 LAB — CBC WITH DIFFERENTIAL/PLATELET
BASO%: 0 % (ref 0.0–2.0)
Basophils Absolute: 0 10*3/uL (ref 0.0–0.1)
Eosinophils Absolute: 0.1 10*3/uL (ref 0.0–0.5)
MCHC: 31.4 g/dL — ABNORMAL LOW (ref 31.5–36.0)
MONO#: 0.5 10*3/uL (ref 0.1–0.9)
MONO%: 12.2 % (ref 0.0–14.0)
NEUT#: 2.1 10*3/uL (ref 1.5–6.5)
NEUT%: 46.7 % (ref 38.4–76.8)
Platelets: 206 10*3/uL (ref 145–400)
RBC: 4.92 10*6/uL (ref 3.70–5.45)
WBC: 4.5 10*3/uL (ref 3.9–10.3)
lymph#: 1.8 10*3/uL (ref 0.9–3.3)

## 2013-09-17 ENCOUNTER — Ambulatory Visit (INDEPENDENT_AMBULATORY_CARE_PROVIDER_SITE_OTHER): Payer: BC Managed Care – PPO | Admitting: Internal Medicine

## 2013-09-17 VITALS — BP 100/60 | HR 167 | Temp 98.6°F | Resp 16 | Ht 59.0 in | Wt 105.0 lb

## 2013-09-17 DIAGNOSIS — R112 Nausea with vomiting, unspecified: Secondary | ICD-10-CM

## 2013-09-17 DIAGNOSIS — J039 Acute tonsillitis, unspecified: Secondary | ICD-10-CM

## 2013-09-17 MED ORDER — ONDANSETRON HCL 4 MG PO TABS
4.0000 mg | ORAL_TABLET | Freq: Once | ORAL | Status: AC
  Administered 2013-09-17: 4 mg via ORAL

## 2013-09-17 MED ORDER — ONDANSETRON HCL 4 MG PO TABS: 4.0000 mg | ORAL_TABLET | Freq: Three times a day (TID) | ORAL | Status: AC | PRN

## 2013-09-17 MED ORDER — AMOXICILLIN 875 MG PO TABS: 875.0000 mg | ORAL_TABLET | Freq: Two times a day (BID) | ORAL | Status: AC

## 2013-09-17 NOTE — Progress Notes (Addendum)
Subjective:  This chart was scribed for Jillian Sia, MD by [name], Medical Scribe. This patient was seen in Room 4 and the patient's care was started at 3:12 PM.   Patient ID: [name], female    DOB: 1981/01/20, 32 y.o.   MRN: [id number]  HPI HPI Comments: [name] is a 32 y.o. female who presents to the [facility] complaining of constant nausea, sore throat, and emesis that started last night.  The patient states that her last episode of emesis was before she came to [facility] and she still feel nauseated.  She denies fever, cough, diaphoresis, and diarrhea as associated symptoms.  She states that she will experience difficulty swallowing while lying down for the past couple of days.  She states that she did not eat any questionable food last night.    The patient states that she was diagnosed with molar lung cancer in 2012  The patient states that she was treated with chemo radiation at [facility].  She states that she also has a history of C-section with no complications.  She states that she has been healthy lately.  She denies being diagnosed with or noticing an enlarged thyroid.  The patient denies being pregnant currently. No longer breast-feeding  Past Medical History  Diagnosis Date  . Hydatidiform mole     hydatidiform dx 5/12  . Abnormal Pap smear 2012    Colpo done by AVS;Last pap 08/2012;was normal  . 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 Portacath placement    . Port-a-cath removal  06/07/2012  . Dilation and evacuation  2012    w/ AVS d/t molar pregnancy  . Cleft lip repair  1984  . Cesarean section N/A 05/25/2013    Procedure: CESAREAN SECTION;  Surgeon: Dorien Chihuahua. Richardson Dopp, MD;  Location: [facility];  Service: Obstetrics;  Laterality: N/A;   Family History  Problem Relation Age of Onset  . Hyperlipidemia Mother    History   Social History  . Marital Status:  Married    Spouse Name: [name]    Number of Children: 0  . Years of Education: 13   Occupational History  . [employer]    Social History Main Topics  . Smoking status: Never Smoker   . Smokeless tobacco: Never Used  . Alcohol Use: No  . Drug Use: No  . Sexual Activity: Yes    Birth Control/ Protection: None     Comment: Pills in the past   Other Topics Concern  . Not on file   Social History Narrative  . No narrative on file   No Known Allergies  Review of Systems A complete 10 system review of systems was obtained and all systems are negative except as noted in the HPI and PMH.    Objective:  Physical Exam  Nursing note and vitals reviewed. Constitutional: She is oriented to person, place, and time. She appears well-developed and well-nourished. No distress.  HENT:  Head: Normocephalic and atraumatic.  Oropharynx with erythema and slight discharge  Eyes: Conjunctivae and EOM are normal. Pupils are equal, round, and reactive to light.  Neck: Neck supple. No thyromegaly present.  Cardiovascular: Normal rate, regular rhythm and normal heart sounds.   No murmur heard. Pulmonary/Chest: Effort normal and breath sounds normal.  Lymphadenopathy:    She has no cervical adenopathy.  Neurological: She is alert and oriented  to person, place, and time. No cranial nerve deficit.  Psychiatric: She has a normal mood and affect. Her behavior is normal.      BP 100/60  Pulse 167  Temp(Src) 98.6 F (37 C) (Oral)  Resp 16  Ht 4\' 11"  (1.499 m)  Wt 105 lb (47.628 kg)  BMI 21.20 kg/m2  SpO2 97% Results for orders placed in visit on 09/17/13  POCT RAPID STREP A (OFFICE)      Result Value Range   Rapid Strep A Screen Negative  Negative    Assessment & Plan:    I have completed the patient encounter in its entirety as documented by the scribe, with editing by me where necessary. [name] P. Merla Riches, M.D.  Acute tonsillitis - Plan: POCT rapid strep A, Culture, Group A  Strep  Nausea with vomiting - Plan: ondansetron (ZOFRAN) tablet 4 mg  Call lab results Meds ordered this encounter  Medications  . ondansetron (ZOFRAN) tablet 4 mg    Sig:   . amoxicillin (AMOXIL) 875 MG tablet    Sig: Take 1 tablet (875 mg total) by mouth 2 (two) times daily.    Dispense:  20 tablet    Refill:  0  . ondansetron (ZOFRAN) 4 MG tablet    Sig: Take 1 tablet (4 mg total) by mouth every 8 (eight) hours as needed for nausea or vomiting.    Dispense:  10 tablet    Refill:  0

## 2013-09-19 LAB — CULTURE, GROUP A STREP

## 2013-11-10 ENCOUNTER — Other Ambulatory Visit (HOSPITAL_BASED_OUTPATIENT_CLINIC_OR_DEPARTMENT_OTHER): Payer: BC Managed Care – PPO

## 2013-11-10 DIAGNOSIS — R748 Abnormal levels of other serum enzymes: Secondary | ICD-10-CM

## 2013-11-10 DIAGNOSIS — E876 Hypokalemia: Secondary | ICD-10-CM

## 2013-11-10 DIAGNOSIS — D649 Anemia, unspecified: Secondary | ICD-10-CM

## 2013-11-10 DIAGNOSIS — D392 Neoplasm of uncertain behavior of placenta: Secondary | ICD-10-CM

## 2013-11-10 DIAGNOSIS — O020 Blighted ovum and nonhydatidiform mole: Secondary | ICD-10-CM

## 2013-11-10 LAB — CBC WITH DIFFERENTIAL/PLATELET
BASO%: 0.3 % (ref 0.0–2.0)
Basophils Absolute: 0 10*3/uL (ref 0.0–0.1)
EOS ABS: 0.1 10*3/uL (ref 0.0–0.5)
EOS%: 1 % (ref 0.0–7.0)
HCT: 37.3 % (ref 34.8–46.6)
HGB: 11.9 g/dL (ref 11.6–15.9)
LYMPH%: 36.2 % (ref 14.0–49.7)
MCH: 24.1 pg — ABNORMAL LOW (ref 25.1–34.0)
MCHC: 31.8 g/dL (ref 31.5–36.0)
MCV: 75.7 fL — AB (ref 79.5–101.0)
MONO#: 0.4 10*3/uL (ref 0.1–0.9)
MONO%: 8 % (ref 0.0–14.0)
NEUT%: 54.5 % (ref 38.4–76.8)
NEUTROS ABS: 3 10*3/uL (ref 1.5–6.5)
PLATELETS: 239 10*3/uL (ref 145–400)
RBC: 4.93 10*6/uL (ref 3.70–5.45)
RDW: 15.3 % — ABNORMAL HIGH (ref 11.2–14.5)
WBC: 5.6 10*3/uL (ref 3.9–10.3)
lymph#: 2 10*3/uL (ref 0.9–3.3)

## 2013-11-10 LAB — COMPREHENSIVE METABOLIC PANEL (CC13)
ALBUMIN: 3.6 g/dL (ref 3.5–5.0)
ALT: 28 U/L (ref 0–55)
AST: 25 U/L (ref 5–34)
Alkaline Phosphatase: 203 U/L — ABNORMAL HIGH (ref 40–150)
Anion Gap: 10 mEq/L (ref 3–11)
BUN: 12.2 mg/dL (ref 7.0–26.0)
CALCIUM: 10.5 mg/dL — AB (ref 8.4–10.4)
CO2: 21 meq/L — AB (ref 22–29)
CREATININE: 0.5 mg/dL — AB (ref 0.6–1.1)
Chloride: 109 mEq/L (ref 98–109)
GLUCOSE: 99 mg/dL (ref 70–140)
Potassium: 3.5 mEq/L (ref 3.5–5.1)
Sodium: 140 mEq/L (ref 136–145)
TOTAL PROTEIN: 7.6 g/dL (ref 6.4–8.3)
Total Bilirubin: 0.74 mg/dL (ref 0.20–1.20)

## 2013-11-13 LAB — BETA HCG QUANT (REF LAB): Beta hCG, Tumor Marker: <2 m[IU]/mL (ref <5.0)

## 2013-11-17 ENCOUNTER — Ambulatory Visit (HOSPITAL_BASED_OUTPATIENT_CLINIC_OR_DEPARTMENT_OTHER): Payer: BC Managed Care – PPO | Admitting: Oncology

## 2013-11-17 ENCOUNTER — Telehealth: Payer: Self-pay | Admitting: Oncology

## 2013-11-17 VITALS — BP 133/87 | HR 128 | Temp 97.6°F | Resp 18 | Ht 59.0 in | Wt 100.0 lb

## 2013-11-17 DIAGNOSIS — E876 Hypokalemia: Secondary | ICD-10-CM

## 2013-11-17 DIAGNOSIS — Z8544 Personal history of malignant neoplasm of other female genital organs: Secondary | ICD-10-CM

## 2013-11-17 DIAGNOSIS — O020 Blighted ovum and nonhydatidiform mole: Secondary | ICD-10-CM

## 2013-11-17 DIAGNOSIS — R748 Abnormal levels of other serum enzymes: Secondary | ICD-10-CM

## 2013-11-17 NOTE — Telephone Encounter (Signed)
gv pt appt schedule for may and august.

## 2013-11-17 NOTE — Progress Notes (Signed)
ID: [name]   DOB: 1980/12/29  MR#: [id number]  VOZ#:[id number]  PCP: Pcp Not In System GYN: Gildardo Cranker OTHER:  CHIEF COMPLAINT:  Hx Molar Pregnancy   HISTORY OF PRESENT ILLNESS: the patient was followed at [facility] and found to have a molar pregnancy.  On 01/23/2011, she had a 1st trimester dilatation and evacuation with the pathology ([id number]) showing an 11-week molar pregnancy.  The preoperative beta-HCG was 261,077.  Repeat 2 weeks postop was down to 88,955, however, repeat May 23rd was 191,584 and on May 26th 236,711.    At that point, the patient was referred here for further evaluation and treatment. Complete treatment history is detailed below  INTERVAL HISTORY:   [name] returns today for routine followup of her history of molar pregnancy accompanied by her husband. The interval history is generally unremarkable. Her baby is 42 months old, and does wake up once or twice at night, which is making them all very tired. She is now just beginning to sleep more through the night at times. [name] is not nursing anymore at this point  REVIEW OF SYSTEMS:  [name] denies any altered taste, nausea, vomiting, or loss of appetite. She continues to work full-time, currently at the [employer]. Aside from the mild fatigue just discussed, a detailed review of systems today was entirely negative   PAST MEDICAL HISTORY: Past Medical History  Diagnosis Date  . Hydatidiform mole     hydatidiform dx 5/12  . Abnormal Pap smear 2012    Colpo done by AVS;Last pap 08/2012;was normal  . Cancer 2012    Lung;from molar pregnancy;cancer free now    PAST SURGICAL HISTORY: Remote cleft palate repair  FAMILY HISTORY  The patient's father is alive at age 76.  The patient's mother is alive at age 75.  The patient has 1 brother, no sisters.  GYNECOLOGIC HISTORY: (Updated 07/07/2013)  GX P1. Healthy baby girl delivered by C-section in September 2014 when [name] was 6.   SOCIAL  HISTORY:  (updated 07/07/2013) The patient is an immigrant from [place] and currently works at [employer] Monday through Saturday.  She usually starts work between 53 and 11 in the morning.  Her husband whose name is [name] (goes by [place]) also has immigrated from [place] and has a job "[employer]   they have a daughter, born September 2014, name [name]. At home, in addition to the patient, her husband, and their daughter, are her parents, her brother, and her brother's wife and child.   ADVANCED DIRECTIVES:  HEALTH MAINTENANCE: (Updated 07/07/2013) History  Substance Use Topics  . Smoking status: Never Smoker   . Smokeless tobacco: Never Used  . Alcohol Use: No     Colonoscopy: Never  PAP: UTD, [facility]  Bone density: Never  Lipid panel:   No Known Allergies  Current Outpatient Prescriptions  Medication Sig Dispense Refill  . acetaminophen (TYLENOL) 325 MG tablet Take 325 mg by mouth every 6 (six) hours as needed for pain (headache).      Marland Kitchen amoxicillin (AMOXIL) 875 MG tablet Take 1 tablet (875 mg total) by mouth 2 (two) times daily.  20 tablet  0  . ibuprofen (ADVIL,MOTRIN) 600 MG tablet Take 1 tablet (600 mg total) by mouth every 6 (six) hours as needed for pain.  36 tablet  2  . ondansetron (ZOFRAN) 4 MG tablet Take 1 tablet (4 mg total) by mouth every 8 (eight) hours as needed for nausea or vomiting.  10 tablet  0  . Prenatal Vit-Fe Fumarate-FA (PRENATAL MULTIVITAMIN) TABS tablet Take 1 tablet by mouth daily at 12 noon.       No current facility-administered medications for this visit.    OBJECTIVE: young [place] woman in no acute distress Filed Vitals:   11/17/13 0850  BP: 133/87  Pulse: 128  Temp: 97.6 F (36.4 C)  Resp: 18     Body mass index is 20.19 kg/(m^2).    ECOG FS:0 Filed Weights   11/17/13 0850  Weight: 100 lb (45.36 kg)   Sclerae unicteric, pupils equal and round Oropharynx clear and moist No cervical or  supraclavicular adenopathy Lungs no rales or rhonchi Heart regular rate and rhythm Abd soft, nontender, positive bowel sounds MSK no focal spinal tenderness, no upper extremity lymphedema Neuro: nonfocal, well oriented, appropriate affect Breasts:     LAB RESULTS:  Lab Results  Component Value Date   WBC 5.6 11/10/2013   NEUTROABS 3.0 11/10/2013   HGB 11.9 11/10/2013   HCT 37.3 11/10/2013   MCV 75.7* 11/10/2013   PLT 239 11/10/2013      Chemistry      Component Value Date/Time   NA 140 11/10/2013 0802   NA 135 05/25/2013 0045   K 3.5 11/10/2013 0802   K 3.9 05/25/2013 0045   CL 98 05/25/2013 0045   CL 107 12/06/2012 0851   CO2 21* 11/10/2013 0802   CO2 17* 05/25/2013 0045   BUN 12.2 11/10/2013 0802   BUN 12 05/25/2013 0045   CREATININE 0.5* 11/10/2013 0802   CREATININE 0.94 05/25/2013 0045      Component Value Date/Time   CALCIUM 10.5* 11/10/2013 0802   CALCIUM 9.0 05/25/2013 0045   ALKPHOS 203* 11/10/2013 0802   ALKPHOS 172* 05/25/2013 0045   AST 25 11/10/2013 0802   AST 26 05/25/2013 0045   ALT 28 11/10/2013 0802   ALT 20 05/25/2013 0045   BILITOT 0.74 11/10/2013 0802   BILITOT 0.4 05/25/2013 0045     Results for CHARMAGNE, BUHL (MRN [id number]) as of 11/17/2013 09:03  Ref. Range 07/18/2011 03:30 11/17/2011 08:58 05/19/2012 08:02 08/24/2012 08:15 12/06/2012 08:51  hCG, Beta Chain, Quant, S No range found <1 <2.0 <2.0 <2.0 55384.0    STUDIES: No results found.  ASSESSMENT:  33 y.o.  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  (3) Status post delivery of a healthy baby girl by C-section September 2014   (4)  Low MCV likely due to thalassemia (no anemia currently)  (5)  Intermittently  elevated liver enzymes    PLAN:  [name] is doing well now more than 2 years out from completion of her chemotherapy for molar pregnancy. We are going to start checking her labwork every 3 months with visit every 6 months.  I am not sure why her alkaline phosphatase is occasionally elevated. It normalizes without intervention and we know she is hepatitis B and a negative. Apparently we have not checked the hepatitis C titer and we will obtain that I would the next set of labs. I am also obtaining a ferritin at that time.  Otherwise I reassured her that there is no evidence of recurrence at  this point. She will call with any problems that may develop before her next visit here. Chauncey Cruel, MD   11/17/2013

## 2014-01-10 ENCOUNTER — Inpatient Hospital Stay (HOSPITAL_COMMUNITY): Payer: BC Managed Care – PPO

## 2014-01-10 ENCOUNTER — Encounter (HOSPITAL_COMMUNITY): Payer: Self-pay | Admitting: Emergency Medicine

## 2014-01-10 ENCOUNTER — Emergency Department (HOSPITAL_COMMUNITY): Payer: BC Managed Care – PPO

## 2014-01-10 ENCOUNTER — Inpatient Hospital Stay (HOSPITAL_COMMUNITY)
Admission: EM | Admit: 2014-01-10 | Discharge: 2014-01-20 | DRG: 871 | Disposition: E | Payer: BC Managed Care – PPO | Attending: Pulmonary Disease | Admitting: Pulmonary Disease

## 2014-01-10 DIAGNOSIS — E0591 Thyrotoxicosis, unspecified with thyrotoxic crisis or storm: Secondary | ICD-10-CM | POA: Diagnosis present

## 2014-01-10 DIAGNOSIS — I498 Other specified cardiac arrhythmias: Secondary | ICD-10-CM

## 2014-01-10 DIAGNOSIS — E872 Acidosis, unspecified: Secondary | ICD-10-CM | POA: Diagnosis present

## 2014-01-10 DIAGNOSIS — O020 Blighted ovum and nonhydatidiform mole: Secondary | ICD-10-CM | POA: Diagnosis present

## 2014-01-10 DIAGNOSIS — E861 Hypovolemia: Secondary | ICD-10-CM | POA: Diagnosis present

## 2014-01-10 DIAGNOSIS — R6520 Severe sepsis without septic shock: Secondary | ICD-10-CM

## 2014-01-10 DIAGNOSIS — G931 Anoxic brain damage, not elsewhere classified: Secondary | ICD-10-CM | POA: Diagnosis present

## 2014-01-10 DIAGNOSIS — E876 Hypokalemia: Secondary | ICD-10-CM

## 2014-01-10 DIAGNOSIS — I471 Supraventricular tachycardia, unspecified: Secondary | ICD-10-CM

## 2014-01-10 DIAGNOSIS — J9600 Acute respiratory failure, unspecified whether with hypoxia or hypercapnia: Secondary | ICD-10-CM | POA: Diagnosis present

## 2014-01-10 DIAGNOSIS — B9689 Other specified bacterial agents as the cause of diseases classified elsewhere: Secondary | ICD-10-CM | POA: Diagnosis present

## 2014-01-10 DIAGNOSIS — I4891 Unspecified atrial fibrillation: Secondary | ICD-10-CM | POA: Diagnosis present

## 2014-01-10 DIAGNOSIS — I369 Nonrheumatic tricuspid valve disorder, unspecified: Secondary | ICD-10-CM

## 2014-01-10 DIAGNOSIS — I469 Cardiac arrest, cause unspecified: Secondary | ICD-10-CM | POA: Diagnosis present

## 2014-01-10 DIAGNOSIS — N179 Acute kidney failure, unspecified: Secondary | ICD-10-CM | POA: Diagnosis present

## 2014-01-10 DIAGNOSIS — R6521 Severe sepsis with septic shock: Secondary | ICD-10-CM

## 2014-01-10 DIAGNOSIS — A419 Sepsis, unspecified organism: Principal | ICD-10-CM | POA: Diagnosis present

## 2014-01-10 DIAGNOSIS — I959 Hypotension, unspecified: Secondary | ICD-10-CM | POA: Diagnosis present

## 2014-01-10 DIAGNOSIS — R0682 Tachypnea, not elsewhere classified: Secondary | ICD-10-CM | POA: Diagnosis present

## 2014-01-10 DIAGNOSIS — R579 Shock, unspecified: Secondary | ICD-10-CM

## 2014-01-10 DIAGNOSIS — E162 Hypoglycemia, unspecified: Secondary | ICD-10-CM | POA: Diagnosis present

## 2014-01-10 DIAGNOSIS — R4020 Unspecified coma: Secondary | ICD-10-CM | POA: Diagnosis present

## 2014-01-10 DIAGNOSIS — E870 Hyperosmolality and hypernatremia: Secondary | ICD-10-CM | POA: Diagnosis present

## 2014-01-10 DIAGNOSIS — I517 Cardiomegaly: Secondary | ICD-10-CM | POA: Diagnosis present

## 2014-01-10 DIAGNOSIS — R748 Abnormal levels of other serum enzymes: Secondary | ICD-10-CM

## 2014-01-10 DIAGNOSIS — D649 Anemia, unspecified: Secondary | ICD-10-CM

## 2014-01-10 DIAGNOSIS — R197 Diarrhea, unspecified: Secondary | ICD-10-CM | POA: Diagnosis present

## 2014-01-10 DIAGNOSIS — Z8674 Personal history of sudden cardiac arrest: Secondary | ICD-10-CM

## 2014-01-10 DIAGNOSIS — Z9221 Personal history of antineoplastic chemotherapy: Secondary | ICD-10-CM

## 2014-01-10 LAB — BASIC METABOLIC PANEL
BUN: 16 mg/dL (ref 6–23)
BUN: 17 mg/dL (ref 6–23)
BUN: 15 mg/dL (ref 6–23)
CHLORIDE: 112 meq/L (ref 96–112)
CHLORIDE: 104 meq/L (ref 96–112)
CO2: 12 meq/L — AB (ref 19–32)
CO2: 23 mEq/L (ref 19–32)
CO2: 30 mEq/L (ref 19–32)
CREATININE: 0.84 mg/dL (ref 0.50–1.10)
Calcium: 8.4 mg/dL (ref 8.4–10.5)
Calcium: 7.7 mg/dL — ABNORMAL LOW (ref 8.4–10.5)
Calcium: 6.4 mg/dL — CL (ref 8.4–10.5)
Chloride: 113 mEq/L — ABNORMAL HIGH (ref 96–112)
Creatinine, Ser: 0.57 mg/dL (ref 0.50–1.10)
Creatinine, Ser: 0.78 mg/dL (ref 0.50–1.10)
GFR calc Af Amer: >90 mL/min (ref >90)
GFR calc non Af Amer: >90 mL/min (ref >90)
GFR calc non Af Amer: >90 mL/min (ref >90)
GLUCOSE: 88 mg/dL (ref 70–99)
GLUCOSE: 115 mg/dL — AB (ref 70–99)
Glucose, Bld: 49 mg/dL — ABNORMAL LOW (ref 70–99)
Potassium: 2.6 mEq/L — CL (ref 3.7–5.3)
Potassium: 3 mEq/L — ABNORMAL LOW (ref 3.7–5.3)
Sodium: 148 mEq/L — ABNORMAL HIGH (ref 137–147)
Sodium: 158 mEq/L — ABNORMAL HIGH (ref 137–147)
Sodium: 163 mEq/L (ref 137–147)

## 2014-01-10 LAB — COMPREHENSIVE METABOLIC PANEL
ALK PHOS: 179 U/L — AB (ref 39–117)
ALT: 23 U/L (ref 0–35)
ALT: 173 U/L — ABNORMAL HIGH (ref 0–35)
AST: 52 U/L — ABNORMAL HIGH (ref 0–37)
AST: 476 U/L — AB (ref 0–37)
Albumin: 2.2 g/dL — ABNORMAL LOW (ref 3.5–5.2)
Albumin: 1.6 g/dL — ABNORMAL LOW (ref 3.5–5.2)
Alkaline Phosphatase: 156 U/L — ABNORMAL HIGH (ref 39–117)
BILIRUBIN TOTAL: 1.4 mg/dL — AB (ref 0.3–1.2)
BUN: 16 mg/dL (ref 6–23)
BUN: 15 mg/dL (ref 6–23)
CHLORIDE: 113 meq/L — AB (ref 96–112)
CO2: <7 mEq/L — CL (ref 19–32)
CO2: 10 meq/L — AB (ref 19–32)
CREATININE: 0.55 mg/dL (ref 0.50–1.10)
Calcium: 8.2 mg/dL — ABNORMAL LOW (ref 8.4–10.5)
Calcium: 8.4 mg/dL (ref 8.4–10.5)
Chloride: 108 mEq/L (ref 96–112)
Creatinine, Ser: 0.55 mg/dL (ref 0.50–1.10)
GFR calc Af Amer: >90 mL/min (ref >90)
GFR calc Af Amer: >90 mL/min (ref >90)
GFR calc non Af Amer: >90 mL/min (ref >90)
Glucose, Bld: 82 mg/dL (ref 70–99)
Glucose, Bld: 58 mg/dL — ABNORMAL LOW (ref 70–99)
Potassium: 3.3 mEq/L — ABNORMAL LOW (ref 3.7–5.3)
Potassium: <2.2 mEq/L — CL (ref 3.7–5.3)
Sodium: 142 mEq/L (ref 137–147)
Sodium: 150 mEq/L — ABNORMAL HIGH (ref 137–147)
Total Bilirubin: 1.3 mg/dL — ABNORMAL HIGH (ref 0.3–1.2)
Total Protein: 5.3 g/dL — ABNORMAL LOW (ref 6.0–8.3)
Total Protein: 4.1 g/dL — ABNORMAL LOW (ref 6.0–8.3)

## 2014-01-10 LAB — LACTIC ACID, PLASMA
LACTIC ACID, VENOUS: 12.1 mmol/L — AB (ref 0.5–2.2)
Lactic Acid, Venous: 19.4 mmol/L — ABNORMAL HIGH (ref 0.5–2.2)
Lactic Acid, Venous: 22.5 mmol/L — ABNORMAL HIGH (ref 0.5–2.2)

## 2014-01-10 LAB — DIC (DISSEMINATED INTRAVASCULAR COAGULATION) PANEL
INR: 2.31 — ABNORMAL HIGH (ref 0.00–1.49)
PROTHROMBIN TIME: 24.6 s — AB (ref 11.6–15.2)

## 2014-01-10 LAB — I-STAT ARTERIAL BLOOD GAS, ED
Acid-base deficit: 21 mmol/L — ABNORMAL HIGH (ref 0.0–2.0)
Acid-base deficit: 24 mmol/L — ABNORMAL HIGH (ref 0.0–2.0)
Bicarbonate: 7.2 mEq/L — ABNORMAL LOW (ref 20.0–24.0)
Bicarbonate: 9.1 mEq/L — ABNORMAL LOW (ref 20.0–24.0)
O2 Saturation: 64 %
O2 Saturation: 92 %
Patient temperature: 96.3
Patient temperature: 98
TCO2: 10 mmol/L (ref 0–100)
TCO2: 8 mmol/L (ref 0–100)
pCO2 arterial: 36.3 mmHg (ref 35.0–45.0)
pCO2 arterial: 44 mmHg (ref 35.0–45.0)
pH, Arterial: 6.902 — CL (ref 7.350–7.450)
pH, Arterial: 6.913 — CL (ref 7.350–7.450)
pO2, Arterial: 54 mmHg — ABNORMAL LOW (ref 80.0–100.0)
pO2, Arterial: 98 mmHg (ref 80.0–100.0)

## 2014-01-10 LAB — POCT I-STAT 3, ART BLOOD GAS (G3+)
ACID-BASE EXCESS: 3 mmol/L — AB (ref 0.0–2.0)
Acid-base deficit: 18 mmol/L — ABNORMAL HIGH (ref 0.0–2.0)
Acid-base deficit: 3 mmol/L — ABNORMAL HIGH (ref 0.0–2.0)
Acid-base deficit: 4 mmol/L — ABNORMAL HIGH (ref 0.0–2.0)
BICARBONATE: 24.5 meq/L — AB (ref 20.0–24.0)
BICARBONATE: 22.1 meq/L (ref 20.0–24.0)
Bicarbonate: 12.3 mEq/L — ABNORMAL LOW (ref 20.0–24.0)
Bicarbonate: 29 mEq/L — ABNORMAL HIGH (ref 20.0–24.0)
O2 SAT: 86 %
O2 Saturation: 84 %
O2 Saturation: 83 %
O2 Saturation: 86 %
PCO2 ART: 47.1 mmHg — AB (ref 35.0–45.0)
PCO2 ART: 62.8 mmHg — AB (ref 35.0–45.0)
PCO2 ART: 54.8 mmHg — AB (ref 35.0–45.0)
PH ART: 7.024 — AB (ref 7.350–7.450)
PH ART: 7.242 — AB (ref 7.350–7.450)
PO2 ART: 67 mmHg — AB (ref 80.0–100.0)
PO2 ART: 68 mmHg — AB (ref 80.0–100.0)
Patient temperature: 103.2
Patient temperature: 102.7
TCO2: 14 mmol/L (ref 0–100)
TCO2: 26 mmol/L (ref 0–100)
TCO2: 23 mmol/L (ref 0–100)
TCO2: 31 mmol/L (ref 0–100)
pCO2 arterial: 52.6 mmHg — ABNORMAL HIGH (ref 35.0–45.0)
pH, Arterial: 7.212 — ABNORMAL LOW (ref 7.350–7.450)
pH, Arterial: 7.334 — ABNORMAL LOW (ref 7.350–7.450)
pO2, Arterial: 71 mmHg — ABNORMAL LOW (ref 80.0–100.0)
pO2, Arterial: 57 mmHg — ABNORMAL LOW (ref 80.0–100.0)

## 2014-01-10 LAB — CBC
HEMATOCRIT: 28.1 % — AB (ref 36.0–46.0)
HEMOGLOBIN: 8.8 g/dL — AB (ref 12.0–15.0)
MCH: 24.6 pg — ABNORMAL LOW (ref 26.0–34.0)
MCHC: 31.3 g/dL (ref 30.0–36.0)
MCV: 78.5 fL (ref 78.0–100.0)
Platelets: 203 10*3/uL (ref 150–400)
RBC: 3.58 MIL/uL — ABNORMAL LOW (ref 3.87–5.11)
RDW: 14.3 % (ref 11.5–15.5)
WBC: 20.2 10*3/uL — AB (ref 4.0–10.5)

## 2014-01-10 LAB — DIC (DISSEMINATED INTRAVASCULAR COAGULATION)PANEL
D-Dimer, Quant: 2.53 ug/mL-FEU — ABNORMAL HIGH (ref 0.00–0.48)
Fibrinogen: 455 mg/dL (ref 204–475)
Platelets: 221 10*3/uL (ref 150–400)
Smear Review: NONE SEEN

## 2014-01-10 LAB — CBC WITH DIFFERENTIAL/PLATELET
BASOS ABS: 0 10*3/uL (ref 0.0–0.1)
Basophils Absolute: 0 10*3/uL (ref 0.0–0.1)
Basophils Relative: 0 % (ref 0–1)
Basophils Relative: 0 % (ref 0–1)
EOS ABS: 0 10*3/uL (ref 0.0–0.7)
Eosinophils Absolute: 0 10*3/uL (ref 0.0–0.7)
Eosinophils Relative: 0 % (ref 0–5)
Eosinophils Relative: 0 % (ref 0–5)
HCT: 34.5 % — ABNORMAL LOW (ref 36.0–46.0)
HCT: 33 % — ABNORMAL LOW (ref 36.0–46.0)
Hemoglobin: 10.6 g/dL — ABNORMAL LOW (ref 12.0–15.0)
Hemoglobin: 10.8 g/dL — ABNORMAL LOW (ref 12.0–15.0)
LYMPHS ABS: 1.7 10*3/uL (ref 0.7–4.0)
Lymphocytes Relative: 9 % — ABNORMAL LOW (ref 12–46)
Lymphocytes Relative: 7 % — ABNORMAL LOW (ref 12–46)
Lymphs Abs: 2 10*3/uL (ref 0.7–4.0)
MCH: 24.7 pg — ABNORMAL LOW (ref 26.0–34.0)
MCH: 26 pg (ref 26.0–34.0)
MCHC: 30.7 g/dL (ref 30.0–36.0)
MCHC: 32.7 g/dL (ref 30.0–36.0)
MCV: 80.2 fL (ref 78.0–100.0)
MCV: 79.3 fL (ref 78.0–100.0)
MONO ABS: 0.5 10*3/uL (ref 0.1–1.0)
MONOS PCT: 2 % — AB (ref 3–12)
Monocytes Absolute: 1.8 10*3/uL — ABNORMAL HIGH (ref 0.1–1.0)
Monocytes Relative: 8 % (ref 3–12)
Neutro Abs: 18.3 10*3/uL — ABNORMAL HIGH (ref 1.7–7.7)
Neutro Abs: 22.6 10*3/uL — ABNORMAL HIGH (ref 1.7–7.7)
Neutrophils Relative %: 83 % — ABNORMAL HIGH (ref 43–77)
Neutrophils Relative %: 91 % — ABNORMAL HIGH (ref 43–77)
PLATELETS: 195 10*3/uL (ref 150–400)
Platelets: 193 10*3/uL (ref 150–400)
RBC: 4.3 MIL/uL (ref 3.87–5.11)
RBC: 4.16 MIL/uL (ref 3.87–5.11)
RDW: 14.3 % (ref 11.5–15.5)
RDW: 15.5 % (ref 11.5–15.5)
WBC Morphology: INCREASED
WBC Morphology: INCREASED
WBC: 22.1 10*3/uL — ABNORMAL HIGH (ref 4.0–10.5)
WBC: 24.8 10*3/uL — AB (ref 4.0–10.5)

## 2014-01-10 LAB — CORTISOL: Cortisol, Plasma: 114.7 ug/dL

## 2014-01-10 LAB — PHOSPHORUS
Phosphorus: 5.9 mg/dL — ABNORMAL HIGH (ref 2.3–4.6)
Phosphorus: 8.5 mg/dL — ABNORMAL HIGH (ref 2.3–4.6)
Phosphorus: 5.8 mg/dL — ABNORMAL HIGH (ref 2.3–4.6)

## 2014-01-10 LAB — TSH
TSH: 0.005 u[IU]/mL — ABNORMAL LOW (ref 0.350–4.500)
TSH: 0.009 u[IU]/mL — ABNORMAL LOW (ref 0.350–4.500)

## 2014-01-10 LAB — MAGNESIUM
Magnesium: 1.9 mg/dL (ref 1.5–2.5)
Magnesium: 2 mg/dL (ref 1.5–2.5)
Magnesium: 1.5 mg/dL (ref 1.5–2.5)
Magnesium: 1.6 mg/dL (ref 1.5–2.5)

## 2014-01-10 LAB — CARBOXYHEMOGLOBIN
Carboxyhemoglobin: 0.8 % (ref 0.5–1.5)
Methemoglobin: 1.2 % (ref 0.0–1.5)
O2 Saturation: 45.9 %
TOTAL HEMOGLOBIN: 10.2 g/dL — AB (ref 12.0–16.0)

## 2014-01-10 LAB — PROTIME-INR
INR: 2.49 — ABNORMAL HIGH (ref 0.00–1.49)
Prothrombin Time: 26.1 seconds — ABNORMAL HIGH (ref 11.6–15.2)

## 2014-01-10 LAB — CBG MONITORING, ED: Glucose-Capillary: 44 mg/dL — CL (ref 70–99)

## 2014-01-10 LAB — GLUCOSE, CAPILLARY: GLUCOSE-CAPILLARY: 76 mg/dL (ref 70–99)

## 2014-01-10 LAB — TROPONIN I
Troponin I: <0.3 ng/mL (ref <0.30)
Troponin I: 0.96 ng/mL (ref <0.30)

## 2014-01-10 LAB — PREPARE RBC (CROSSMATCH)

## 2014-01-10 LAB — MRSA PCR SCREENING: MRSA BY PCR: POSITIVE — AB

## 2014-01-10 LAB — T4, FREE: FREE T4: 4.06 ng/dL — AB (ref 0.80–1.80)

## 2014-01-10 LAB — APTT: aPTT: 58 seconds — ABNORMAL HIGH (ref 24–37)

## 2014-01-10 LAB — ABO/RH: ABO/RH(D): B POS

## 2014-01-10 LAB — T3, FREE: T3, Free: 9.7 pg/mL — ABNORMAL HIGH (ref 2.3–4.2)

## 2014-01-10 LAB — PROCALCITONIN: PROCALCITONIN: 119.43 ng/mL

## 2014-01-10 LAB — HCG, QUANTITATIVE, PREGNANCY

## 2014-01-10 MED ORDER — SODIUM BICARBONATE 8.4 % IV SOLN
INTRAVENOUS | Status: AC
  Filled 2014-01-10: qty 100

## 2014-01-10 MED ORDER — EPINEPHRINE HCL 1 MG/ML IJ SOLN
0.5000 ug/min | INTRAMUSCULAR | Status: DC
  Administered 2014-01-10: 20 ug/min via INTRAVENOUS
  Administered 2014-01-10: 5 ug/min via INTRAVENOUS
  Filled 2014-01-10 (×2): qty 1

## 2014-01-10 MED ORDER — ADENOSINE 6 MG/2ML IV SOLN: 12.0000 mg | Freq: Once | INTRAVENOUS | Status: AC

## 2014-01-10 MED ORDER — HEPARIN SODIUM (PORCINE) 1000 UNIT/ML DIALYSIS
1000.0000 [IU] | INTRAMUSCULAR | Status: DC | PRN
  Filled 2014-01-10: qty 6
  Filled 2014-01-10: qty 4

## 2014-01-10 MED ORDER — SODIUM BICARBONATE 8.4 % IV SOLN
50.0000 meq | Freq: Once | INTRAVENOUS | Status: AC
  Administered 2014-01-10: 50 meq via INTRAVENOUS
  Filled 2014-01-10: qty 50

## 2014-01-10 MED ORDER — DOPAMINE-DEXTROSE 3.2-5 MG/ML-% IV SOLN
2.0000 ug/kg/min | INTRAVENOUS | Status: DC
  Administered 2014-01-10: 20 ug/kg/min via INTRAVENOUS
  Filled 2014-01-10: qty 250

## 2014-01-10 MED ORDER — SODIUM CHLORIDE 0.9 % IV SOLN
Freq: Once | INTRAVENOUS | Status: AC
  Administered 2014-01-10: 19:00:00 via INTRAVENOUS

## 2014-01-10 MED ORDER — HEPARIN BOLUS VIA INFUSION
2500.0000 [IU] | Freq: Once | INTRAVENOUS | Status: AC
  Administered 2014-01-10: 2500 [IU] via INTRAVENOUS
  Filled 2014-01-10: qty 2500

## 2014-01-10 MED ORDER — PROPOFOL 10 MG/ML IV BOLUS
INTRAVENOUS | Status: AC
  Administered 2014-01-10: 20 mg
  Filled 2014-01-10: qty 20

## 2014-01-10 MED ORDER — PIPERACILLIN-TAZOBACTAM 3.375 G IVPB
3.3750 g | Freq: Four times a day (QID) | INTRAVENOUS | Status: DC
  Filled 2014-01-10 (×2): qty 50

## 2014-01-10 MED ORDER — ADENOSINE 6 MG/2ML IV SOLN
INTRAVENOUS | Status: AC
  Filled 2014-01-10: qty 4

## 2014-01-10 MED ORDER — PANTOPRAZOLE SODIUM 40 MG IV SOLR
40.0000 mg | INTRAVENOUS | Status: DC
  Administered 2014-01-10: 40 mg via INTRAVENOUS
  Filled 2014-01-10: qty 40

## 2014-01-10 MED ORDER — VANCOMYCIN HCL 500 MG IV SOLR
500.0000 mg | INTRAVENOUS | Status: DC
  Filled 2014-01-10: qty 500

## 2014-01-10 MED ORDER — PRISMASOL BGK 4/2.5 32-4-2.5 MEQ/L IV SOLN
INTRAVENOUS | Status: DC
  Filled 2014-01-10 (×6): qty 5000

## 2014-01-10 MED ORDER — IODINE STRONG (LUGOLS) 5 % PO SOLN: 0.2000 mL | Freq: Three times a day (TID) | ORAL | Status: DC

## 2014-01-10 MED ORDER — HYDROCORTISONE NA SUCCINATE PF 100 MG IJ SOLR
50.0000 mg | Freq: Once | INTRAMUSCULAR | Status: AC
  Administered 2014-01-10: 50 mg via INTRAVENOUS
  Filled 2014-01-10: qty 1

## 2014-01-10 MED ORDER — VANCOMYCIN HCL 500 MG IV SOLR
500.0000 mg | Freq: Three times a day (TID) | INTRAVENOUS | Status: DC
  Filled 2014-01-10 (×2): qty 500

## 2014-01-10 MED ORDER — STERILE WATER FOR IRRIGATION IR SOLN
Freq: Four times a day (QID) | Status: DC
  Administered 2014-01-10: 20:00:00 via TOPICAL
  Filled 2014-01-10 (×5): qty 100

## 2014-01-10 MED ORDER — PIPERACILLIN-TAZOBACTAM 3.375 G IVPB 30 MIN
3.3750 g | Freq: Once | INTRAVENOUS | Status: AC
  Administered 2014-01-10: 3.375 g via INTRAVENOUS

## 2014-01-10 MED ORDER — LORAZEPAM 2 MG/ML IJ SOLN
INTRAMUSCULAR | Status: AC
  Filled 2014-01-10: qty 1

## 2014-01-10 MED ORDER — IODINE STRONG (LUGOLS) 5 % PO SOLN
0.2000 mL | Freq: Three times a day (TID) | ORAL | Status: DC
  Administered 2014-01-10 – 2014-01-11 (×2): 0.2 mL via ORAL
  Filled 2014-01-10 (×4): qty 0.2

## 2014-01-10 MED ORDER — CHLORHEXIDINE GLUCONATE CLOTH 2 % EX PADS
6.0000 | MEDICATED_PAD | Freq: Every day | CUTANEOUS | Status: DC
  Administered 2014-01-11: 6 via TOPICAL

## 2014-01-10 MED ORDER — EPINEPHRINE HCL 1 MG/ML IJ SOLN: 0.5000 ug/min | INTRAMUSCULAR | Status: DC

## 2014-01-10 MED ORDER — STERILE WATER FOR INJECTION IV SOLN
INTRAVENOUS | Status: DC
  Administered 2014-01-10 – 2014-01-11 (×6): via INTRAVENOUS_CENTRAL
  Filled 2014-01-10 (×19): qty 150

## 2014-01-10 MED ORDER — PROTAMINE SULFATE 10 MG/ML IV SOLN
25.0000 mg | Freq: Once | INTRAVENOUS | Status: AC
  Administered 2014-01-10: 25 mg via INTRAVENOUS
  Filled 2014-01-10: qty 2.5

## 2014-01-10 MED ORDER — HYDROCORTISONE NA SUCCINATE PF 100 MG IJ SOLR
100.0000 mg | Freq: Three times a day (TID) | INTRAMUSCULAR | Status: DC
  Administered 2014-01-10 – 2014-01-11 (×2): 100 mg via INTRAVENOUS
  Filled 2014-01-10 (×5): qty 2

## 2014-01-10 MED ORDER — SUCCINYLCHOLINE CHLORIDE 20 MG/ML IJ SOLN
INTRAMUSCULAR | Status: AC
  Filled 2014-01-10: qty 1

## 2014-01-10 MED ORDER — PIPERACILLIN-TAZOBACTAM 3.375 G IVPB
3.3750 g | Freq: Three times a day (TID) | INTRAVENOUS | Status: DC
  Filled 2014-01-10 (×3): qty 50

## 2014-01-10 MED ORDER — PANTOPRAZOLE SODIUM 40 MG IV SOLR
8.0000 mg/h | INTRAVENOUS | Status: DC
  Administered 2014-01-10 – 2014-01-11 (×2): 8 mg/h via INTRAVENOUS
  Filled 2014-01-10 (×5): qty 80

## 2014-01-10 MED ORDER — METOPROLOL TARTRATE 1 MG/ML IV SOLN
INTRAVENOUS | Status: AC
  Filled 2014-01-10: qty 5

## 2014-01-10 MED ORDER — MUPIROCIN 2 % EX OINT
1.0000 "application " | TOPICAL_OINTMENT | Freq: Two times a day (BID) | CUTANEOUS | Status: DC
  Administered 2014-01-10 (×2): 1 via NASAL
  Filled 2014-01-10: qty 22

## 2014-01-10 MED ORDER — SODIUM BICARBONATE 8.4 % IV SOLN
INTRAVENOUS | Status: AC
  Administered 2014-01-10: 50 meq
  Filled 2014-01-10: qty 50

## 2014-01-10 MED ORDER — SODIUM BICARBONATE 8.4 % IV SOLN
INTRAVENOUS | Status: AC
  Administered 2014-01-10: 100 meq
  Filled 2014-01-10: qty 100

## 2014-01-10 MED ORDER — VANCOMYCIN HCL IN DEXTROSE 1-5 GM/200ML-% IV SOLN
1000.0000 mg | Freq: Once | INTRAVENOUS | Status: AC
  Administered 2014-01-10: 1000 mg via INTRAVENOUS

## 2014-01-10 MED ORDER — PRISMASOL BGK 4/2.5 32-4-2.5 MEQ/L IV SOLN
INTRAVENOUS | Status: DC
  Administered 2014-01-10 – 2014-01-11 (×6): via INTRAVENOUS_CENTRAL
  Filled 2014-01-10 (×16): qty 5000

## 2014-01-10 MED ORDER — NOREPINEPHRINE BITARTRATE 1 MG/ML IJ SOLN
2.0000 ug/min | INTRAVENOUS | Status: DC
  Administered 2014-01-10: 30 ug/min via INTRAVENOUS
  Filled 2014-01-10 (×2): qty 4

## 2014-01-10 MED ORDER — METHIMAZOLE 10 MG PO TABS
20.0000 mg | ORAL_TABLET | Freq: Three times a day (TID) | ORAL | Status: DC
  Filled 2014-01-10 (×2): qty 2

## 2014-01-10 MED ORDER — PIPERACILLIN-TAZOBACTAM 3.375 G IVPB 30 MIN
3.3750 g | Freq: Four times a day (QID) | INTRAVENOUS | Status: DC
  Administered 2014-01-10 – 2014-01-11 (×2): 3.375 g via INTRAVENOUS
  Filled 2014-01-10 (×6): qty 50

## 2014-01-10 MED ORDER — NOREPINEPHRINE BITARTRATE 1 MG/ML IJ SOLN
2.0000 ug/min | INTRAVENOUS | Status: DC
  Administered 2014-01-10 (×2): 30 ug/min via INTRAVENOUS
  Administered 2014-01-10 – 2014-01-11 (×2): 50 ug/min via INTRAVENOUS
  Filled 2014-01-10 (×3): qty 16

## 2014-01-10 MED ORDER — CALCIUM CHLORIDE 10 % IV SOLN
1.0000 g | Freq: Once | INTRAVENOUS | Status: AC
  Administered 2014-01-10: 1 g via INTRAVENOUS

## 2014-01-10 MED ORDER — SODIUM CHLORIDE 0.9 % FOR CRRT
INTRAVENOUS_CENTRAL | Status: DC | PRN
  Filled 2014-01-10: qty 1000

## 2014-01-10 MED ORDER — PROPYLTHIOURACIL 50 MG PO TABS
200.0000 mg | ORAL_TABLET | ORAL | Status: DC
  Filled 2014-01-10 (×5): qty 4

## 2014-01-10 MED ORDER — EPINEPHRINE HCL 1 MG/ML IJ SOLN
0.5000 ug/min | INTRAVENOUS | Status: DC
  Administered 2014-01-10 – 2014-01-11 (×4): 20 ug/min via INTRAVENOUS
  Filled 2014-01-10 (×5): qty 4

## 2014-01-10 MED ORDER — LORAZEPAM 2 MG/ML IJ SOLN
1.0000 mg | Freq: Once | INTRAMUSCULAR | Status: AC
  Administered 2014-01-10: 1 mg via INTRAVENOUS

## 2014-01-10 MED ORDER — HEPARIN (PORCINE) IN NACL 100-0.45 UNIT/ML-% IJ SOLN
500.0000 [IU]/h | INTRAMUSCULAR | Status: DC
  Administered 2014-01-10: 500 [IU]/h via INTRAVENOUS
  Filled 2014-01-10 (×2): qty 250

## 2014-01-10 MED ORDER — SODIUM BICARBONATE 8.4 % IV SOLN
150.0000 meq | Freq: Once | INTRAVENOUS | Status: AC
  Administered 2014-01-10: 150 meq via INTRAVENOUS
  Filled 2014-01-10: qty 150

## 2014-01-10 MED ORDER — SODIUM CHLORIDE 0.9 % IV SOLN
INTRAVENOUS | Status: DC
  Administered 2014-01-10: 10 mL/h via INTRAVENOUS

## 2014-01-10 MED ORDER — HYDROCORTISONE NA SUCCINATE PF 100 MG IJ SOLR
50.0000 mg | Freq: Four times a day (QID) | INTRAMUSCULAR | Status: DC
  Administered 2014-01-10: 50 mg via INTRAVENOUS
  Filled 2014-01-10 (×3): qty 1

## 2014-01-10 MED ORDER — BIOTENE DRY MOUTH MT LIQD
15.0000 mL | Freq: Four times a day (QID) | OROMUCOSAL | Status: DC
  Administered 2014-01-10 – 2014-01-11 (×3): 15 mL via OROMUCOSAL

## 2014-01-10 MED ORDER — SODIUM BICARBONATE 8.4 % IV SOLN
INTRAVENOUS | Status: DC
  Administered 2014-01-10 – 2014-01-11 (×3): via INTRAVENOUS
  Filled 2014-01-10 (×10): qty 150

## 2014-01-10 MED ORDER — SODIUM BICARBONATE 8.4 % IV SOLN
50.0000 meq | Freq: Once | INTRAVENOUS | Status: AC
  Administered 2014-01-10: 50 meq via INTRAVENOUS

## 2014-01-10 MED ORDER — POTASSIUM CHLORIDE 10 MEQ/50ML IV SOLN
10.0000 meq | INTRAVENOUS | Status: AC
  Administered 2014-01-10 – 2014-01-11 (×6): 10 meq via INTRAVENOUS
  Filled 2014-01-10 (×6): qty 50

## 2014-01-10 MED ORDER — METHYLPREDNISOLONE SODIUM SUCC 40 MG IJ SOLR
40.0000 mg | Freq: Every day | INTRAMUSCULAR | Status: DC
  Filled 2014-01-10: qty 1

## 2014-01-10 MED ORDER — VASOPRESSIN 20 UNIT/ML IJ SOLN
0.0100 [IU]/min | INTRAVENOUS | Status: DC
  Administered 2014-01-10: 0.03 [IU]/min via INTRAVENOUS
  Filled 2014-01-10: qty 2.5

## 2014-01-10 MED ORDER — ETOMIDATE 2 MG/ML IV SOLN
INTRAVENOUS | Status: AC
  Filled 2014-01-10: qty 20

## 2014-01-10 MED ORDER — LEVOFLOXACIN IN D5W 750 MG/150ML IV SOLN
750.0000 mg | INTRAVENOUS | Status: DC
  Administered 2014-01-10: 750 mg via INTRAVENOUS
  Filled 2014-01-10: qty 150

## 2014-01-10 MED ORDER — CHLORHEXIDINE GLUCONATE 0.12 % MT SOLN
15.0000 mL | Freq: Two times a day (BID) | OROMUCOSAL | Status: DC
  Administered 2014-01-11 (×2): 15 mL via OROMUCOSAL
  Filled 2014-01-10 (×5): qty 15

## 2014-01-10 MED ORDER — PIPERACILLIN-TAZOBACTAM 3.375 G IVPB 30 MIN
3.3750 g | Freq: Four times a day (QID) | INTRAVENOUS | Status: DC
  Filled 2014-01-10: qty 50

## 2014-01-10 MED ORDER — PHENYLEPHRINE HCL 10 MG/ML IJ SOLN
30.0000 ug/min | INTRAMUSCULAR | Status: DC
  Administered 2014-01-10: 200 ug/min via INTRAVENOUS
  Administered 2014-01-10: 100 ug/min via INTRAVENOUS
  Administered 2014-01-11 (×2): 200 ug/min via INTRAVENOUS
  Filled 2014-01-10 (×6): qty 4

## 2014-01-10 MED ORDER — FENTANYL CITRATE 0.05 MG/ML IJ SOLN
50.0000 ug | Freq: Once | INTRAMUSCULAR | Status: AC
  Administered 2014-01-10: 50 ug via INTRAVENOUS

## 2014-01-10 MED ORDER — STERILE WATER FOR INJECTION IV SOLN
INTRAVENOUS | Status: DC
  Administered 2014-01-11 (×3): via INTRAVENOUS_CENTRAL
  Filled 2014-01-10 (×11): qty 150

## 2014-01-10 MED ORDER — LIDOCAINE HCL (CARDIAC) 20 MG/ML IV SOLN
INTRAVENOUS | Status: AC
  Filled 2014-01-10: qty 5

## 2014-01-10 MED ORDER — LEVOFLOXACIN IN D5W 250 MG/50ML IV SOLN
250.0000 mg | INTRAVENOUS | Status: DC
  Filled 2014-01-10: qty 50

## 2014-01-10 MED ORDER — FENTANYL CITRATE 0.05 MG/ML IJ SOLN
INTRAMUSCULAR | Status: AC
  Filled 2014-01-10: qty 2

## 2014-01-10 MED ORDER — SODIUM BICARBONATE 8.4 % IV SOLN
INTRAVENOUS | Status: DC
  Administered 2014-01-10: 15:00:00 via INTRAVENOUS
  Filled 2014-01-10 (×7): qty 150

## 2014-01-10 MED ORDER — POTASSIUM CHLORIDE 10 MEQ/50ML IV SOLN
10.0000 meq | INTRAVENOUS | Status: AC
  Administered 2014-01-10 (×6): 10 meq via INTRAVENOUS
  Filled 2014-01-10 (×5): qty 50

## 2014-01-10 MED ORDER — ROCURONIUM BROMIDE 50 MG/5ML IV SOLN
INTRAVENOUS | Status: AC
  Administered 2014-01-10: 40 mg
  Filled 2014-01-10: qty 2

## 2014-01-10 MED FILL — Medication: Qty: 1 | Status: AC

## 2014-01-10 NOTE — Procedures (Signed)
Central Venous Catheter Insertion Procedure Note [name] [id number] 1980/12/19  Procedure: Insertion of Central Venous Catheter Indications: Assessment of intravascular volume, Drug and/or fluid administration and Frequent blood sampling  Procedure Details Consent: Unable to obtain consent because of emergent medical necessity. Time Out: Verified patient identification, verified procedure, site/side was marked, verified correct patient position, special equipment/implants available, medications/allergies/relevent history reviewed, required imaging and test results available.  Performed  Maximum sterile technique was used including antiseptics, cap, gloves, gown, hand hygiene, mask and sheet. Skin prep: Chlorhexidine; local anesthetic administered A antimicrobial bonded/coated triple lumen catheter was placed in the right femoral vein due to emergent situation using the Seldinger technique.  Evaluation Blood flow good Complications: No apparent complications Patient did tolerate procedure well.  Procedure performed under direct US guidance.   Montey Hora, PA - C  Pulmonary & Critical Care Pgr: [phone]  or [phone]    I was present for procedure.  Chesley Mires, MD [facility] 2014/01/21, 3:42 PM Pager:  [phone] After 3pm call: [phone]

## 2014-01-10 NOTE — ED Notes (Signed)
Adenosine given

## 2014-01-10 NOTE — Consult Note (Signed)
Reason for Consult: Metabolic acidosis refractory to medical management Referring Physician: Simonne Maffucci MD (CCM)  HPI:  33 year old [name] of [place] origin with history of metastatic molar pregnancy apparently in remission brought to ER with nausea, vomiting and diarrhea with fever and shortness of breath for 1 day. In ER had cardiac arrest and VDRF and has been intubated and placed on pressor support. Suspected thyroid storm (TSH very low) v/s sepsis (high procalcitonin). She has had metabolic acidosis refractory to medical management and RRT support requested.  Past Medical History  Diagnosis Date  . Hydatidiform mole     hydatidiform dx 5/12  . Abnormal Pap smear 2012    Colpo done by AVS;Last pap 08/2012;was normal  . 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 Portacath placement    . Port-a-cath removal  06/07/2012  . Dilation and evacuation  2012    w/ AVS d/t molar pregnancy  . Cleft lip repair  1984  . Cesarean section N/A 05/25/2013    Procedure: CESAREAN SECTION;  Surgeon: Maeola Sarah. Landry Mellow, MD;  Location: [facility];  Service: Obstetrics;  Laterality: N/A;    Family History  Problem Relation Age of Onset  . Hyperlipidemia Mother     Social History:  reports that she has never smoked. She has never used smokeless tobacco. She reports that she does not drink alcohol or use illicit drugs.  Allergies: No Known Allergies  Medications:  Scheduled: . adenosine (ADENOCARD) IV  12 mg Intravenous Once  . antiseptic oral rinse  15 mL Mouth Rinse QID  . chlorhexidine  15 mL Mouth Rinse BID  . [START ON 01/21/14] Chlorhexidine Gluconate Cloth  6 each Topical Q0600  . etomidate      . fentaNYL      . hydrocortisone sod succinate (SOLU-CORTEF) inj  100 mg Intravenous Q8H  . levofloxacin (LEVAQUIN) IV  750 mg Intravenous Q24H  . lidocaine (cardiac) 100 mg/69m      . LORazepam      . irrigation builder    Topical Q6H  . metoprolol      . mupirocin ointment  1 application Nasal BID  . piperacillin-tazobactam (ZOSYN)  IV  3.375 g Intravenous Q8H  . potassium chloride  10 mEq Intravenous Q1 Hr x 6  . succinylcholine      . vancomycin  500 mg Intravenous Q8H    Results for orders placed during the hospital encounter of 12/31/2013 (from the past 48 hour(s))  CBC WITH DIFFERENTIAL     Status: Abnormal   Collection Time    01/17/2014 10:32 AM      Result Value Ref Range   WBC 22.1 (*) 4.0 - 10.5 K/uL   Comment: WHITE COUNT CONFIRMED ON SMEAR   RBC 4.30  3.87 - 5.11 MIL/uL   Hemoglobin 10.6 (*) 12.0 - 15.0 g/dL   HCT 34.5 (*) 36.0 - 46.0 %   MCV 80.2  78.0 - 100.0 fL   MCH 24.7 (*) 26.0 - 34.0 pg   MCHC 30.7  30.0 - 36.0 g/dL   RDW 14.3  11.5 - 15.5 %   Platelets 193  150 - 400 K/uL   Comment: PLATELET COUNT CONFIRMED BY SMEAR   Neutrophils Relative % 83 (*) 43 - 77 %   Lymphocytes Relative 9 (*) 12 - 46 %   Monocytes Relative 8  3 - 12 %   Eosinophils Relative  0  0 - 5 %   Basophils Relative 0  0 - 1 %   Neutro Abs 18.3 (*) 1.7 - 7.7 K/uL   Lymphs Abs 2.0  0.7 - 4.0 K/uL   Monocytes Absolute 1.8 (*) 0.1 - 1.0 K/uL   Eosinophils Absolute 0.0  0.0 - 0.7 K/uL   Basophils Absolute 0.0  0.0 - 0.1 K/uL   RBC Morphology POLYCHROMASIA PRESENT     Comment: BURR CELLS   WBC Morphology INCREASED BANDS (>20% BANDS)     Comment: MILD LEFT SHIFT (1-5% METAS, OCC MYELO, OCC BANDS)     DOHLE BODIES  COMPREHENSIVE METABOLIC PANEL     Status: Abnormal   Collection Time    12/23/2013 10:32 AM      Result Value Ref Range   Sodium 142  137 - 147 mEq/L   Potassium 3.3 (*) 3.7 - 5.3 mEq/L   Chloride 108  96 - 112 mEq/L   CO2 <7 (*) 19 - 32 mEq/L   Comment: CRITICAL RESULT CALLED TO, READ BACK BY AND VERIFIED WITH:     Mali GROSE,RN AT 1140 01/09/2014 BY ZBEECH.   Glucose, Bld 82  70 - 99 mg/dL   BUN 16  6 - 23 mg/dL   Creatinine, Ser 0.55  0.50 - 1.10 mg/dL   Calcium 8.2 (*) 8.4 - 10.5 mg/dL   Total  Protein 5.3 (*) 6.0 - 8.3 g/dL   Albumin 2.2 (*) 3.5 - 5.2 g/dL   AST 52 (*) 0 - 37 U/L   ALT 23  0 - 35 U/L   Alkaline Phosphatase 156 (*) 39 - 117 U/L   Total Bilirubin 1.3 (*) 0.3 - 1.2 mg/dL   GFR calc non Af Amer >90  >90 mL/min   GFR calc Af Amer >90  >90 mL/min   Comment: (NOTE)     The eGFR has been calculated using the CKD EPI equation.     This calculation has not been validated in all clinical situations.     eGFR's persistently <90 mL/min signify possible Chronic Kidney     Disease.  MAGNESIUM     Status: None   Collection Time    01/19/2014 10:32 AM      Result Value Ref Range   Magnesium 1.9  1.5 - 2.5 mg/dL  PHOSPHORUS     Status: Abnormal   Collection Time    12/27/2013 10:32 AM      Result Value Ref Range   Phosphorus 5.9 (*) 2.3 - 4.6 mg/dL  TROPONIN I     Status: None   Collection Time    12/25/2013 10:32 AM      Result Value Ref Range   Troponin I <0.30  <0.30 ng/mL   Comment:            Due to the release kinetics of cTnI,     a negative result within the first hours     of the onset of symptoms does not rule out     myocardial infarction with certainty.     If myocardial infarction is still suspected,     repeat the test at appropriate intervals.  APTT     Status: Abnormal   Collection Time    12/24/2013 11:15 AM      Result Value Ref Range   aPTT 58 (*) 24 - 37 seconds   Comment:            IF BASELINE aPTT IS ELEVATED,  SUGGEST PATIENT RISK ASSESSMENT     BE USED TO DETERMINE APPROPRIATE     ANTICOAGULANT THERAPY.  PROTIME-INR     Status: Abnormal   Collection Time    01/04/2014 11:15 AM      Result Value Ref Range   Prothrombin Time 26.1 (*) 11.6 - 15.2 seconds   INR 2.Jillian (*) 0.00 - 1.Jillian  CBG MONITORING, ED     Status: Abnormal   Collection Time    12/23/2013 11:21 AM      Result Value Ref Range   Glucose-Capillary 44 (*) 70 - 99 mg/dL  I-STAT ARTERIAL BLOOD GAS, ED     Status: Abnormal   Collection Time    01/08/2014 11:37 AM      Result Value  Ref Range   pH, Arterial 6.902 (*) 7.350 - 7.450   pCO2 arterial 36.3  35.0 - 45.0 mmHg   pO2, Arterial 54.0 (*) 80.0 - 100.0 mmHg   Bicarbonate 7.2 (*) 20.0 - 24.0 mEq/L   TCO2 8  0 - 100 mmol/L   O2 Saturation 64.0     Acid-base deficit 24.0 (*) 0.0 - 2.0 mmol/L   Patient temperature 98.0 F     Collection site FEMORAL ARTERY     Drawn by Nurse     Sample type ARTERIAL     Comment NOTIFIED PHYSICIAN    COMPREHENSIVE METABOLIC PANEL     Status: Abnormal   Collection Time    01/03/2014 11:42 AM      Result Value Ref Range   Sodium 150 (*) 137 - 147 mEq/L   Potassium <2.2 (*) 3.7 - 5.3 mEq/L   Comment: REPEATED TO VERIFY     CRITICAL RESULT CALLED TO, READ BACK BY AND VERIFIED WITH:     CRYSTAL MANES,RN AT 1510 12/22/2013 BY ZBEECH.   Chloride 113 (*) 96 - 112 mEq/L   CO2 10 (*) 19 - 32 mEq/L   Comment: CRITICAL RESULT CALLED TO, READ BACK BY AND VERIFIED WITH:     CRYSTAL MANES,RN AT 1510 12/31/2013 BY ZBEECH.   Glucose, Bld 58 (*) 70 - 99 mg/dL   BUN 15  6 - 23 mg/dL   Creatinine, Ser 0.55  0.50 - 1.10 mg/dL   Calcium 8.4  8.4 - 10.5 mg/dL   Total Protein 4.1 (*) 6.0 - 8.3 g/dL   Albumin 1.6 (*) 3.5 - 5.2 g/dL   AST 476 (*) 0 - 37 U/L   ALT 173 (*) 0 - 35 U/L   Alkaline Phosphatase 179 (*) 39 - 117 U/L   Total Bilirubin 1.4 (*) 0.3 - 1.2 mg/dL   GFR calc non Af Amer >90  >90 mL/min   GFR calc Af Amer >90  >90 mL/min   Comment: (NOTE)     The eGFR has been calculated using the CKD EPI equation.     This calculation has not been validated in all clinical situations.     eGFR's persistently <90 mL/min signify possible Chronic Kidney     Disease.  MAGNESIUM     Status: None   Collection Time    01/02/2014 11:42 AM      Result Value Ref Range   Magnesium 2.0  1.5 - 2.5 mg/dL  PHOSPHORUS     Status: Abnormal   Collection Time    12/21/2013 11:42 AM      Result Value Ref Range   Phosphorus 8.5 (*) 2.3 - 4.6 mg/dL  CORTISOL     Status: None  Collection Time    01/19/2014 11:43 AM       Result Value Ref Range   Cortisol, Plasma 114.7     Comment: (NOTE)     Result confirmed by automatic dilution.     AM:  4.3 - 22.4 ug/dL     PM:  3.1 - 16.7 ug/dL     Performed at [facility]  TROPONIN I     Status: Abnormal   Collection Time    12/28/2013 11:46 AM      Result Value Ref Range   Troponin I 0.96 (*) <0.30 ng/mL   Comment:            Due to the release kinetics of cTnI,     a negative result within the first hours     of the onset of symptoms does not rule out     myocardial infarction with certainty.     If myocardial infarction is still suspected,     repeat the test at appropriate intervals.     CRITICAL RESULT CALLED TO, READ BACK BY AND VERIFIED WITH:     CRYSTAL MANES,RN AT [place] 12/24/2013 BY ZBEECH.  TSH     Status: Abnormal   Collection Time    12/31/2013 11:53 AM      Result Value Ref Range   TSH 0.005 (*) 0.350 - 4.500 uIU/mL   Comment: Please note change in reference range.  LACTIC ACID, PLASMA     Status: Abnormal   Collection Time    01/16/2014 11:53 AM      Result Value Ref Range   Lactic Acid, Venous 19.4 (*) 0.5 - 2.2 mmol/L  I-STAT ARTERIAL BLOOD GAS, ED     Status: Abnormal   Collection Time    12/23/2013 12:04 PM      Result Value Ref Range   pH, Arterial 6.913 (*) 7.350 - 7.450   pCO2 arterial 44.0  35.0 - 45.0 mmHg   pO2, Arterial 98.0  80.0 - 100.0 mmHg   Bicarbonate 9.1 (*) 20.0 - 24.0 mEq/L   TCO2 10  0 - 100 mmol/L   O2 Saturation 92.0     Acid-base deficit 21.0 (*) 0.0 - 2.0 mmol/L   Patient temperature 96.3 F     Collection site ARTERIAL LINE     Drawn by Operator     Sample type ARTERIAL     Comment NOTIFIED PHYSICIAN    PROCALCITONIN     Status: None   Collection Time    01/07/2014 12:08 PM      Result Value Ref Range   Procalcitonin 119.43     Comment:            Interpretation:     PCT >= 10 ng/mL:     Important systemic inflammatory response,     almost exclusively due to severe bacterial     sepsis or septic  shock.     (NOTE)             ICU PCT Algorithm               Non ICU PCT Algorithm        ----------------------------     ------------------------------             PCT < 0.25 ng/mL                 PCT < 0.1 ng/mL         Stopping of antibiotics  Stopping of antibiotics           strongly encouraged.               strongly encouraged.        ----------------------------     ------------------------------           PCT level decrease by               PCT < 0.25 ng/mL           >= 80% from peak PCT           OR PCT 0.25 - 0.5 ng/mL          Stopping of antibiotics                                                 encouraged.         Stopping of antibiotics               encouraged.        ----------------------------     ------------------------------           PCT level decrease by              PCT >= 0.25 ng/mL           < 80% from peak PCT            AND PCT >= 0.5 ng/mL            Continuing antibiotics                                                  encouraged.           Continuing antibiotics                encouraged.        ----------------------------     ------------------------------         PCT level increase compared          PCT > 0.5 ng/mL             with peak PCT AND              PCT >= 0.5 ng/mL             Escalation of antibiotics                                              strongly encouraged.          Escalation of antibiotics            strongly encouraged.  MRSA PCR SCREENING     Status: Abnormal   Collection Time    12/28/2013  1:06 PM      Result Value Ref Range   MRSA by PCR POSITIVE (*) NEGATIVE   Comment:            The GeneXpert MRSA Assay (FDA     approved for NASAL specimens     only), is one component of a     comprehensive MRSA colonization  surveillance program. It is not     intended to diagnose MRSA     infection nor to guide or     monitor treatment for     MRSA infections.     RESULT CALLED TO, READ BACK BY AND VERIFIED  WITH:     C. MANUS 14:35 12/22/2013 ([email or web address])  DIC (DISSEMINATED INTRAVASCULAR COAGULATION) PANEL     Status: Abnormal   Collection Time    01/05/2014  3:04 PM      Result Value Ref Range   Prothrombin Time 24.6 (*) 11.6 - 15.2 seconds   INR 2.31 (*) 0.00 - 1.Jillian   aPTT >200 (*) 24 - 37 seconds   Comment:            IF BASELINE aPTT IS ELEVATED,     SUGGEST PATIENT RISK ASSESSMENT     BE USED TO DETERMINE APPROPRIATE     ANTICOAGULANT THERAPY.     REPEATED TO VERIFY     CRITICAL RESULT CALLED TO, READ BACK BY AND VERIFIED WITH:     C MANUS,RN 1613 12/30/2013 D BRADLEY   Fibrinogen 455  204 - 475 mg/dL   D-Dimer, Quant 2.53 (*) 0.00 - 0.48 ug/mL-FEU   Comment:            AT THE INHOUSE ESTABLISHED CUTOFF     VALUE OF 0.48 ug/mL FEU,     THIS ASSAY HAS BEEN DOCUMENTED     IN THE LITERATURE TO HAVE     A SENSITIVITY AND NEGATIVE     PREDICTIVE VALUE OF AT LEAST     98 TO 99%.  THE TEST RESULT     SHOULD BE CORRELATED WITH     AN ASSESSMENT OF THE CLINICAL     PROBABILITY OF DVT / VTE.   Platelets 221  150 - 400 K/uL   Smear Review NO SCHISTOCYTES SEEN    BASIC METABOLIC PANEL     Status: Abnormal   Collection Time    12/22/2013  3:11 PM      Result Value Ref Range   Sodium 148 (*) 137 - 147 mEq/L   Potassium <2.2 (*) 3.7 - 5.3 mEq/L   Comment: REPEATED TO VERIFY     CRITICAL RESULT CALLED TO, READ BACK BY AND VERIFIED WITH:     C MCKOEWN,RN 1607 12/21/2013 WBOND   Chloride 112  96 - 112 mEq/L   CO2 12 (*) 19 - 32 mEq/L   Glucose, Bld 88  70 - 99 mg/dL   BUN 16  6 - 23 mg/dL   Creatinine, Ser 0.57  0.50 - 1.10 mg/dL   Calcium 8.4  8.4 - 10.5 mg/dL   GFR calc non Af Amer >90  >90 mL/min   GFR calc Af Amer >90  >90 mL/min   Comment: (NOTE)     The eGFR has been calculated using the CKD EPI equation.     This calculation has not been validated in all clinical situations.     eGFR's persistently <90 mL/min signify possible Chronic Kidney     Disease.  HCG, QUANTITATIVE, PREGNANCY      Status: None   Collection Time    12/22/2013  3:11 PM      Result Value Ref Range   hCG, Beta Chain, Quant, S <1  <5 mIU/mL   Comment:              GEST. AGE      CONC.  (mIU/mL)       <=  1 WEEK        5 - 50         2 WEEKS       50 - 500         3 WEEKS       100 - 10,000         4 WEEKS     1,000 - 30,000         5 WEEKS     3,500 - 115,000       6-8 WEEKS     12,000 - 270,000        12 WEEKS     15,000 - 220,000                FEMALE AND NON-PREGNANT FEMALE:         LESS THAN 5 mIU/mL  TSH     Status: Abnormal   Collection Time    12/21/2013  3:19 PM      Result Value Ref Range   TSH 0.009 (*) 0.350 - 4.500 uIU/mL   Comment: Please note change in reference range.  POCT I-STAT 3, ART BLOOD GAS (G3+)     Status: Abnormal   Collection Time    01/15/2014  3:24 PM      Result Value Ref Range   pH, Arterial 7.024 (*) 7.350 - 7.450   pCO2 arterial 47.1 (*) 35.0 - 45.0 mmHg   pO2, Arterial 71.0 (*) 80.0 - 100.0 mmHg   Bicarbonate 12.3 (*) 20.0 - 24.0 mEq/L   TCO2 14  0 - 100 mmol/L   O2 Saturation 84.0     Acid-base deficit 18.0 (*) 0.0 - 2.0 mmol/L   Patient temperature 98.2 F     Collection site ARTERIAL LINE     Drawn by Operator     Sample type ARTERIAL     Comment NOTIFIED PHYSICIAN    GLUCOSE, CAPILLARY     Status: None   Collection Time    12/31/2013  4:03 PM      Result Value Ref Range   Glucose-Capillary 76  70 - 99 mg/dL  CARBOXYHEMOGLOBIN     Status: Abnormal   Collection Time    01/13/2014  5:05 PM      Result Value Ref Range   Total hemoglobin 10.2 (*) 12.0 - 16.0 g/dL   O2 Saturation 45.9     Carboxyhemoglobin 0.8  0.5 - 1.5 %   Methemoglobin 1.2  0.0 - 1.5 %  LACTIC ACID, PLASMA     Status: Abnormal   Collection Time    12/22/2013  5:12 PM      Result Value Ref Range   Lactic Acid, Venous 12.1 (*) 0.5 - 2.2 mmol/L  CBC     Status: Abnormal   Collection Time    12/26/2013  6:09 PM      Result Value Ref Range   WBC 20.2 (*) 4.0 - 10.5 K/uL   RBC 3.58 (*) 3.87 -  5.11 MIL/uL   Hemoglobin 8.8 (*) 12.0 - 15.0 g/dL   Comment: REPEATED TO VERIFY   HCT 28.1 (*) 36.0 - 46.0 %   MCV 78.5  78.0 - 100.0 fL   MCH 24.6 (*) 26.0 - 34.0 pg   MCHC 31.3  30.0 - 36.0 g/dL   RDW 14.3  11.5 - 15.5 %   Platelets 203  150 - 400 K/uL  POCT I-STAT 3, ART BLOOD GAS (G3+)     Status: Abnormal  Collection Time    12/29/2013  6:13 PM      Result Value Ref Range   pH, Arterial 7.212 (*) 7.350 - 7.450   pCO2 arterial 62.8 (*) 35.0 - 45.0 mmHg   pO2, Arterial 67.0 (*) 80.0 - 100.0 mmHg   Bicarbonate 24.5 (*) 20.0 - 24.0 mEq/L   TCO2 26  0 - 100 mmol/L   O2 Saturation 83.0     Acid-base deficit 3.0 (*) 0.0 - 2.0 mmol/L   Patient temperature 103.2 F     Collection site ARTERIAL LINE     Drawn by Nurse     Sample type ARTERIAL     Comment NOTIFIED PHYSICIAN    POCT I-STAT 3, ART BLOOD GAS (G3+)     Status: Abnormal   Collection Time    01/03/2014  7:04 PM      Result Value Ref Range   pH, Arterial 7.242 (*) 7.350 - 7.450   pCO2 arterial 52.6 (*) 35.0 - 45.0 mmHg   pO2, Arterial 68.0 (*) 80.0 - 100.0 mmHg   Bicarbonate 22.1  20.0 - 24.0 mEq/L   TCO2 23  0 - 100 mmol/L   O2 Saturation 86.0     Acid-base deficit 4.0 (*) 0.0 - 2.0 mmol/L   Patient temperature 102.7 F     Collection site ARTERIAL LINE     Drawn by Operator     Sample type ARTERIAL      Dg Chest Port 1 View  12/25/2013   CLINICAL DATA:  Dialysis catheter placement.  Cancer.  EXAM: PORTABLE CHEST - 1 VIEW  COMPARISON:  DG CHEST 1V PORT dated 12/26/2013  FINDINGS: Lateral right midlung nodule consistent with the given history of cancer. New right internal jugular dialysis catheter place with its tip in the mid SVC and no pneumothorax. Tubular device is otherwise stable. Airspace disease throughout the right lung has improved. It is stable throughout the left lung and is asymmetrically worse than that of the right. Upper normal heart size.  IMPRESSION: New right internal jugular dialysis catheter with its tip  in the mid SVC and no pneumothorax.  Bilateral airspace disease is stable on the left but improved on the right.   Electronically Signed   By: Maryclare Bean M.D.   On: 01/17/2014 18:51   Dg Chest Port 1 View  12/26/2013   CLINICAL DATA:  Central line placement  EXAM: PORTABLE CHEST - 1 VIEW  COMPARISON:  DG CHEST 1V PORT dated 12/26/2013; US FETAL NUCHAL TRANSLUCENCY MEASUREMENT dated 11/01/2012; SP REMOVAL TUN ACCESS W/ PORT W/O FL dated 06/07/2012  FINDINGS: Endotracheal tube again identified with tip just above the carinal. Left internal jugular central line has been placed with tip over the cavoatrial junction. An NG tube crosses the gastroesophageal junction. No pneumothorax. There is extensive hazy opacity over the lower 2/3 of both lungs, new on the right and mildly worse on the left. Costophrenic angle blunting bilaterally is also present.  IMPRESSION: Lines and tubes as described. Bilateral hazy opacities. Pulmonary edema suspected.   Electronically Signed   By: Skipper Cliche M.D.   On: 01/01/2014 16:21   Dg Chest Portable 1 View  12/24/2013   CLINICAL DATA:  Intubation  EXAM: PORTABLE CHEST - 1 VIEW  COMPARISON:  11/17/2011  FINDINGS: Endotracheal tube is 15 mm above the carina. Numerous EKG leads overlying the chest.  Left lower lobe airspace disease consistent with pneumonia or atelectasis. No effusion. Right lung is clear.  IMPRESSION: Extensive  left lower lobe airspace disease which may represent pneumonia or atelectasis. Endotracheal tube 15 mm above the carina.   Electronically Signed   By: Franchot Gallo M.D.   On: 12/25/2013 11:29   Dg Abd Portable 1v  12/26/2013   CLINICAL DATA:  Abdominal distention  EXAM: PORTABLE ABDOMEN - 1 VIEW  COMPARISON:  None.  FINDINGS: NG tube tip is in the antrum. No disproportionate dilatation of bowel. No obvious free intraperitoneal gas. Bilateral groin vascular catheters are in place.  IMPRESSION: Nonobstructive bowel gas pattern.  NG tube.   Electronically  Signed   By: Maryclare Bean M.D.   On: 01/18/2014 18:52    Review of Systems  Unable to perform ROS: acuity of condition   Blood pressure 101/54, pulse 36, temperature 103 F (39.4 C), temperature source Core (Comment), resp. rate 36, height _0  (1.473 m), weight 45.4 kg (100 lb 1.4 oz), SpO2 75.00%. Physical Exam  Nursing note and vitals reviewed. Constitutional: She appears well-developed.  HENT:  Head: Normocephalic and atraumatic.  Nose: Nose normal.  Intubated  Neck: No JVD present. No thyromegaly present.  Intubated  Cardiovascular: Exam reveals no friction rub.   No murmur heard. Irregular tachycardia  Respiratory: No respiratory distress. She has no wheezes. She has rales. She exhibits no tenderness.  Fine bibasal rales  GI: Soft. She exhibits distension. There is no tenderness. There is no rebound and no guarding.  Scant bowel sounds  Musculoskeletal: She exhibits no edema.  Lymphadenopathy:    She has no cervical adenopathy.  Neurological:  Sedated/unresponsive  Skin: Skin is dry. No rash noted. No erythema.    Assessment/Plan: 1. Metabolic acidosis refractory to medical management: will start CRRT urgently with the goal of corrected her acidosis and improving her vascular contractility/ BP. 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 3. Possible sepsis: On broad spectrum antibiotic therapy/pressors. CRRT for metabolic acidosis 4. S/P VDRF and cardiac arrest: Poor prognosis and will attempt to maximize supportive care as she is a young mother   Clayborne Dana. Kairos Panetta 01/06/2014, 7:20 PM

## 2014-01-10 NOTE — Progress Notes (Signed)
[facility]  Max dose pressors including epinephrine Methimazole given 1 U PRBC ordered CRRT starting  Explained to family that likelihood of surviving this illness is exceedingly low.  Further CPR is futile, they voiced understanding.  Will give full support with ventilator, vasopressors, and CVVHD, but withhold further CPR.  Roselie Awkward, MD [facility] Pager: [phone] Cell: [phone] If no response, call [phone]

## 2014-01-10 NOTE — ED Notes (Signed)
Dopamine increased to 20; compressions continued

## 2014-01-10 NOTE — ED Notes (Addendum)
Family escorted to family room. Preparing for intubation.

## 2014-01-10 NOTE — ED Notes (Signed)
1 amp calcium given

## 2014-01-10 NOTE — ED Notes (Signed)
Dopamine drip started at 10; loss of pulses; CPR started.

## 2014-01-10 NOTE — ED Notes (Signed)
Phleb at bedside  

## 2014-01-10 NOTE — ED Notes (Signed)
1st attempt to call report 

## 2014-01-10 NOTE — Progress Notes (Signed)
CRITICAL VALUE ALERT  Critical value received: Na 163 and Ca 6.4  Date of notification:  01/04/2014  Time of notification:  2238  Critical value read back:yes  Nurse who received alert:  York Pellant RN  MD notified (1st page):  Dr Despina Hick  Time of first page:  2238  Responding MD:  Despina Hick  Time MD responded:  2238

## 2014-01-10 NOTE — Progress Notes (Signed)
Echocardiogram 2D Echocardiogram has been performed.  [name] 01/10/2014, 1:55 PM

## 2014-01-10 NOTE — ED Notes (Signed)
Portable called for confirmation.

## 2014-01-10 NOTE — ED Notes (Signed)
Pulses back

## 2014-01-10 NOTE — Progress Notes (Signed)
[facility]  Chart reviewed, case discussed with partner Came back for PM re-round Shock worse, max dose levophed Unresponsive Remains acidemic BP improved somewhat with Bicarb  O:  Filed Vitals:   12/28/2013 1515 01/12/2014 1530 01/09/2014 1545 01/09/2014 1600  BP:    87/44  Pulse: 142 145 147 149  Temp: 97.9 F (36.6 C) 98.7 F (37.1 C) 99.3 F (37.4 C) 99.9 F (37.7 C)  TempSrc:    Core (Comment)  Resp: 35 35 35 35  Height:      Weight:      SpO2: 93% 93% 95% 94%   Gen: unresponsive on vent HEENT: NCAT, pupils, dilated, sluggish response to light PULM: Few crackles in bases CV: Tachy, regular AB: BS infrequent, distended but not rigid Ext: warm, no edema Neuro: GCS 3  Labs reviewed, TSH noted to be 0.005  Impression: 1) Severe shock post cardiac arrest> ddx septic shock (most likely), vs high output cardiac failure from thyroid storm (case reports of hyditaform mole and hyperthyroidism in literature) vs shock due to severe acidosis 2) SVT : again, worrisome for thyroid storm 3) AKI due to shock 4) Acute liver injury due to shock 5) NSTEMI > demand 6) Acute respiratory failure 7) Acute encephalopathy worrisome for anoxic brain injury 8) Severe hypokalemia due to nausea/vomiting   Plan: 1) Stat Endocrinology consult 2) Repeat TSH/T3/T4 3) methimazole IV prep by pharmacy (avoiding PTU with liver injury), if cannot make IV, then go with suppository 4) KCL replacement now 5) Frequent BMET, check Magnesium 6) Bicarb replacement 7) continue hydrocortisone 8) Consider Lugol's solution drops in mouth 10 drops tid (but don't order until methimazole given, do not give iodine until one hour after methimazole given)  Additional CC time by me 60 minutes.  Roselie Awkward, MD [facility] Pager: [phone] Cell: [phone] If no response, call [phone]

## 2014-01-10 NOTE — Progress Notes (Signed)
Critical Value: K less than 2.2, CO2 10  Date of notification:  Jan 24, 2014  Time of notification:  3:10  Critical value read back:yes  Nurse who received alert:  Sanjuana Kava, RN  MD notified (1st page):  Dr. Lake Bells, present on unit at time of notification  Time of first page:  3:10  MD notified (2nd page):  Time of second page:  Responding MD:  Dr. Lake Bells  Time MD responded:  3:10   Indicated to redraw labs

## 2014-01-10 NOTE — ED Notes (Signed)
Cardiology at beside.

## 2014-01-10 NOTE — ED Notes (Signed)
Warm blankets on pt.

## 2014-01-10 NOTE — Progress Notes (Signed)
CRITICAL VALUE ALERT  Critical value received:  K less than 2.2  Date of notification:  12/21/2013   Time of notification:  1605  Critical value read back:yes  Nurse who received alert:  Vicente Masson RN  MD notified (1st page):  McQuaid   Time of first page:  1606  MD notified (2nd page):  Time of second page:  Responding MD:  Lake Bells  Time MD responded:  [phone]

## 2014-01-10 NOTE — ED Provider Notes (Signed)
CSN: [id number]     Arrival date & time 01/17/2014  [id number] History   First MD Initiated Contact with Patient 12/31/2013 1000     Chief Complaint  Patient presents with  . Tachycardia     (Consider location/radiation/quality/duration/timing/severity/associated sxs/prior Treatment) HPI  33yF with tachycardia and hypotension. 2012. Pt began feeling poorly yesterday evening.  Pt began feeling poorly yesterday. Unclear if abrupt onset or more gradual. General malaise, fatigue, heart palpitations and dyspnea. Has been having vomiting and diarrhea for past 2-3 days. Subjective fever. No unusual leg pain or swelling. No hx of DVT/PE. 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 Treated with chemotherapy between June of 2012 and October of 2012, with normalization of her beta hCG. All chemotherapy completed mid October. Seems to be otherwise healthy. No chronic meds. Reports took one dose of an OTC cold medicine because of the way she was feeling last night. Denies drug use or ingestion.   Past Medical History  Diagnosis Date  . Hydatidiform mole     hydatidiform dx 5/12  . Abnormal Pap smear 2012    Colpo done by AVS;Last pap 08/2012;was normal  . 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 Portacath placement    . Port-a-cath removal  06/07/2012  . Dilation and evacuation  2012    w/ AVS d/t molar pregnancy  . Cleft lip repair  1984  . Cesarean section N/A 05/25/2013    Procedure: CESAREAN SECTION;  Surgeon: Maeola Sarah. Landry Mellow, MD;  Location: [facility];  Service: Obstetrics;  Laterality: N/A;   Family History  Problem Relation Age of Onset  . Hyperlipidemia Mother    History  Substance Use Topics  . Smoking status: Never Smoker   . Smokeless tobacco: Never Used  . Alcohol Use: No   OB  History   Grav Para Term Preterm Abortions TAB SAB Ect Mult Living   2 1 1  1  1   1      Review of Systems  Level 5 caveat because critical illness.  Allergies  Review of patient's allergies indicates no known allergies.  Home Medications   Prior to Admission medications   Medication Sig Start Date End Date Taking? Authorizing Provider  acetaminophen (TYLENOL) 325 MG tablet Take 325 mg by mouth every 6 (six) hours as needed for pain (headache).    Historical Provider, MD  amoxicillin (AMOXIL) 875 MG tablet Take 1 tablet (875 mg total) by mouth 2 (two) times daily. 09/17/13   Leandrew Koyanagi, MD  ibuprofen (ADVIL,MOTRIN) 600 MG tablet Take 1 tablet (600 mg total) by mouth every 6 (six) hours as needed for pain. 05/28/13   Donnel Saxon, CNM  ondansetron (ZOFRAN) 4 MG tablet Take 1 tablet (4 mg total) by mouth every 8 (eight) hours as needed for nausea or vomiting. 09/17/13   Leandrew Koyanagi, MD  Prenatal Vit-Fe Fumarate-FA (PRENATAL MULTIVITAMIN) TABS tablet Take 1 tablet by mouth daily at 12 noon.    Historical Provider, MD   BP 99/82  Pulse 89  Temp(Src) 97.8 F (36.6 C) (Oral)  Resp 22  SpO2 100% Physical Exam  Nursing note and vitals reviewed. Constitutional: She appears well-developed and well-nourished. She appears distressed.  Appears pale/ill  HENT:  Head: Normocephalic and atraumatic.  Eyes: Conjunctivae are normal. Pupils are  equal, round, and reactive to light. Right eye exhibits no discharge. Left eye exhibits no discharge.  Neck: Neck supple.  No nuchal rigidity  Cardiovascular: Regular rhythm and normal heart sounds.  Exam reveals no gallop and no friction rub.   No murmur heard. Marked tachycardia  Pulmonary/Chest: She is in respiratory distress.  Abdominal: Soft. She exhibits no distension. There is no tenderness.  Musculoskeletal: She exhibits no edema and no tenderness.  Lower extremities symmetric as compared to each other. No calf tenderness. Negative  Homan's. No palpable cords.   Neurological:  Drowsy. Will answer questions clearly and appropriately but labored to respond. Globally weak. No focal motor deficit.   Skin: Skin is warm and dry.  Psychiatric: She has a normal mood and affect. Her behavior is normal. Thought content normal.    ED Course  CARDIOVERSION Date/Time: Jan 21, 2014 10:15 AM Performed by: Virgel Manifold Authorized by: Virgel Manifold Consent: The procedure was performed in an emergent situation. Required items: required blood products, implants, devices, and special equipment available Patient identity confirmed: arm band and provided demographic data Time out: Immediately prior to procedure a "time out" was called to verify the correct patient, procedure, equipment, support staff and site/side marked as required. Patient sedated: yes Cardioversion basis: emergent Pre-procedure rhythm: supraventricular tachycardia Patient position: patient was placed in a supine position Chest area: chest area exposed Electrodes: pads Electrodes placed: anterior-posterior Number of attempts: 3 Attempt 1 mode: synchronous Attempt 1 waveform: biphasic Attempt 1 shock (in Joules): 100 Attempt 1 outcome: no change in rhythm Attempt 2 mode: synchronous Attempt 2 waveform: biphasic Attempt 2 shock (in Joules): 150 Attempt 2 outcome: no change in rhythm Attempt 3 mode: synchronous Attempt 3 waveform: biphasic Attempt 3 shock (in Joules): 200 Attempt 3 outcome: no change in rhythm Post-procedure rhythm: supraventricular tachycardia    INTUBATION Performed by: Virgel Manifold  Required items: required blood products, implants, devices, and special equipment available Patient identity confirmed: provided demographic data and hospital-assigned identification number Time out: Immediately prior to procedure a "time out" was called to verify the correct patient, procedure, equipment, support staff and site/side marked as  required.  Indications: Respiratory failure. Need for airway protection  Intubation method: Glidescope Laryngoscopy   Preoxygenation: NRB  Sedatives: none Paralytic: Rocuronium  Tube Size: 7.0, cuffed Post-procedure assessment: chest rise and ETCO2 monitor Breath sounds: equal and absent over the epigastrium Tube secured with: ETT holder Chest x-ray interpreted by radiologist and me.  Chest x-ray findings: endotracheal tube in appropriate position  Patient tolerated the procedure well with no immediate complications.  CRITICAL CARE Performed by: Virgel Manifold Total critical care time: 45 minutes Critical care time was exclusive of separately billable procedures and treating other patients. Critical care was necessary to treat or prevent imminent or life-threatening deterioration. Critical care was time spent personally by me on the following activities: development of treatment plan with patient and/or surrogate as well as nursing, discussions with consultants, evaluation of patient's response to treatment, examination of patient, obtaining history from patient or surrogate, ordering and performing treatments and interventions, ordering and review of laboratory studies, ordering and review of radiographic studies, pulse oximetry and re-evaluation of patient's condition.   (including critical care time) Labs Review Labs Reviewed  MRSA PCR SCREENING - Abnormal; Notable for the following:    MRSA by PCR POSITIVE (*)    All other components within normal limits  CBC WITH DIFFERENTIAL - Abnormal; Notable for the following:    WBC 22.1 (*)  Hemoglobin 10.6 (*)    HCT 34.5 (*)    MCH 24.7 (*)    Neutrophils Relative % 83 (*)    Lymphocytes Relative 9 (*)    Neutro Abs 18.3 (*)    Monocytes Absolute 1.8 (*)    All other components within normal limits  COMPREHENSIVE METABOLIC PANEL - Abnormal; Notable for the following:    Potassium 3.3 (*)    CO2 <7 (*)    Calcium 8.2 (*)     Total Protein 5.3 (*)    Albumin 2.2 (*)    AST 52 (*)    Alkaline Phosphatase 156 (*)    Total Bilirubin 1.3 (*)    All other components within normal limits  PHOSPHORUS - Abnormal; Notable for the following:    Phosphorus 5.9 (*)    All other components within normal limits  TSH - Abnormal; Notable for the following:    TSH 0.005 (*)    All other components within normal limits  LACTIC ACID, PLASMA - Abnormal; Notable for the following:    Lactic Acid, Venous 19.4 (*)    All other components within normal limits  APTT - Abnormal; Notable for the following:    aPTT 58 (*)    All other components within normal limits  PROTIME-INR - Abnormal; Notable for the following:    Prothrombin Time 26.1 (*)    INR 2.49 (*)    All other components within normal limits  COMPREHENSIVE METABOLIC PANEL - Abnormal; Notable for the following:    Sodium 150 (*)    Potassium <2.2 (*)    Chloride 113 (*)    CO2 10 (*)    Glucose, Bld 58 (*)    Total Protein 4.1 (*)    Albumin 1.6 (*)    AST 476 (*)    ALT 173 (*)    Alkaline Phosphatase 179 (*)    Total Bilirubin 1.4 (*)    All other components within normal limits  PHOSPHORUS - Abnormal; Notable for the following:    Phosphorus 8.5 (*)    All other components within normal limits  TROPONIN I - Abnormal; Notable for the following:    Troponin I 0.96 (*)    All other components within normal limits  BASIC METABOLIC PANEL - Abnormal; Notable for the following:    Sodium 148 (*)    Potassium <2.2 (*)    CO2 12 (*)    All other components within normal limits  I-STAT ARTERIAL BLOOD GAS, ED - Abnormal; Notable for the following:    pH, Arterial 6.902 (*)    pO2, Arterial 54.0 (*)    Bicarbonate 7.2 (*)    Acid-base deficit 24.0 (*)    All other components within normal limits  CBG MONITORING, ED - Abnormal; Notable for the following:    Glucose-Capillary 44 (*)    All other components within normal limits  I-STAT ARTERIAL BLOOD GAS,  ED - Abnormal; Notable for the following:    pH, Arterial 6.913 (*)    Bicarbonate 9.1 (*)    Acid-base deficit 21.0 (*)    All other components within normal limits  POCT I-STAT 3, ART BLOOD GAS (G3+) - Abnormal; Notable for the following:    pH, Arterial 7.024 (*)    pCO2 arterial 47.1 (*)    pO2, Arterial 71.0 (*)    Bicarbonate 12.3 (*)    Acid-base deficit 18.0 (*)    All other components within normal limits  CULTURE, BLOOD (ROUTINE  X 2)  CULTURE, BLOOD (ROUTINE X 2)  CULTURE, RESPIRATORY (NON-EXPECTORATED)  URINE CULTURE  MAGNESIUM  TROPONIN I  MAGNESIUM  DIC (DISSEMINATED INTRAVASCULAR COAGULATION) PANEL  GLUCOSE, CAPILLARY  PREGNANCY, URINE  URINALYSIS, ROUTINE W REFLEX MICROSCOPIC  URINE RAPID DRUG SCREEN (HOSP PERFORMED)  TSH  LACTIC ACID, PLASMA  CORTISOL  HEPARIN LEVEL (UNFRACTIONATED)  TROPONIN I  TROPONIN I  PROTIME-INR  APTT  PROCALCITONIN  BLOOD GAS, ARTERIAL  TSH  T3, FREE  T4, FREE  HCG, SERUM, QUALITATIVE  BASIC METABOLIC PANEL  CARBOXYHEMOGLOBIN  BLOOD GAS, ARTERIAL    Imaging Review Dg Chest Portable 1 View  12/25/2013   CLINICAL DATA:  Intubation  EXAM: PORTABLE CHEST - 1 VIEW  COMPARISON:  11/17/2011  FINDINGS: Endotracheal tube is 15 mm above the carina. Numerous EKG leads overlying the chest.  Left lower lobe airspace disease consistent with pneumonia or atelectasis. No effusion. Right lung is clear.  IMPRESSION: Extensive left lower lobe airspace disease which may represent pneumonia or atelectasis. Endotracheal tube 15 mm above the carina.   Electronically Signed   By: Franchot Gallo M.D.   On: 12/25/2013 11:29     EKG Interpretation   Date/Time:  Tuesday January 10 2014 10:26:36 EDT Ventricular Rate:  113 PR Interval:  156 QRS Duration: 87 QT Interval:  336 QTC Calculation: 461 R Axis:   78 Text Interpretation:  Sinus tachycardia Repol abnrm, severe global  ischemia  Confirmed by Wilson Singer  MD, Jatavious Peppard ([id number]) on 01/12/2014 2:52:36 PM       MDM   Final diagnoses:  SVT (supraventricular tachycardia)  Acute respiratory failure  Cardiac arrest  Shock  Elevated liver enzymes  Hypokalemia    33yF arriving by EMS in extremis. Regular narrow complex tachycardia with little variation in rate from ~168-170. Given adenosine 6/12 pre-hospital and another 12mg  in ED. Pt remained tachypneic with RR 40+ and BP labile. Attempted electro cardioversion x3 without effect. BP improved somewhat and given small dose of metoprolol with conversion to sinus tach ~120. BP again dropping despite conversion to sinus rhythm and almost 2L of IVF at that point. Remained with increased work of breathing. Decreasing mental status most likely from meds from cardioversion versus worsening perfusion. Moved to larger [place] for intubation/line. CCM arrived as preparing to intubate. Cards consulted. Empiric abx for possible sepsis, although clincally feel less likely. PE? Cardiomyopathy? Admit to critical care.      Virgel Manifold, MD 12/25/2013 [phone]

## 2014-01-10 NOTE — ED Notes (Signed)
EKG performed at this time; rhythm slowing.

## 2014-01-10 NOTE — ED Notes (Signed)
Prepping for A line; pulses remain.

## 2014-01-10 NOTE — ED Notes (Signed)
Non rebreather applied. Pt pale; somolent

## 2014-01-10 NOTE — ED Notes (Signed)
Epi given

## 2014-01-10 NOTE — ED Notes (Signed)
2 mg of metoprolol given slowly at this time

## 2014-01-10 NOTE — ED Notes (Signed)
Pt has gone PEA; compressions started 1 mg of epi in now.

## 2014-01-10 NOTE — ED Notes (Signed)
Intubating at this time.

## 2014-01-10 NOTE — ED Notes (Signed)
Prepping for central line

## 2014-01-10 NOTE — ED Notes (Signed)
Report given to Crystal, RN.

## 2014-01-10 NOTE — ED Notes (Signed)
Cap refill low; attempting to get blood.

## 2014-01-10 NOTE — Consult Note (Addendum)
Reason for Consult: SVT with hypotension   Referring Physician: ER MD, Dr. Carolyne Littles Not In System Primary Cardiologist:New--Dr. Lahoma Rocker Tirone is an 33 y.o. female.    Chief Complaint: pt presented with  2 day hx of nausea and vomiting and diarrhea.  BP on arrival 70. She was also in SVT.  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 Jana Hakim.  Normal pregnancy 05/2013.  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  She had been doing well.  2 days ago developed nausea and  Vomiting and some diarrhea.  She went home from work yesterday due to fever and not feeling well-per her husband.  Today she felt weak and presented to ER.  BP was 70 systolic and her HR 381 SVT.  She was given IV adenosine.  Shocked X 3 and finally with total of 2 mg IV metoprolol she slowed to a SR.  BP improved to 829 systolic initially. She developed resp. failure and was intubated.  Her HR then slowed and she went asystole.  Code called, CPR for 3-4 min.  Responded to epinepherine.  BP remained low and Levophed drip started.  CCM, ER MD and Cardiologist at bedside.  IV fluids given.       Past Medical History  Diagnosis Date  . Hydatidiform mole     hydatidiform dx 5/12  . Abnormal Pap smear 2012    Colpo done by AVS;Last pap 08/2012;was normal  . 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 Portacath placement    . Port-a-cath removal  06/07/2012  . Dilation and evacuation  2012    w/ AVS d/t molar pregnancy  . Cleft lip repair  1984  . Cesarean section N/A 05/25/2013    Procedure: CESAREAN SECTION;  Surgeon: Maeola Sarah. Landry Mellow, MD;  Location: [facility];  Service: Obstetrics;  Laterality: N/A;    Family History  Problem Relation Age of Onset  . Hyperlipidemia Mother     Social History:  reports that she has never smoked. She has never used smokeless tobacco. She reports that she does not drink alcohol or use illicit drugs.  Allergies: No Known Allergies  Outpatient medications: Tylenol prn advil prn zofran 4 mg every 8 hours prn Prenatal vit, fe daily.  ROS: General:low grade fevers, not feeling well GI:+o diarrhea + nausea and vomiting Unable to obtain more- pt in respiratory failure and then intubated.  The obove from her statements on arrival to Nurse   Blood pressure 99/82, pulse 89, temperature 97.8 F (36.6 C), temperature source Oral, resp. rate 22, SpO2 100.00%. PE: Per Dr. 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  Treated with adenosine, shock X 3 and metoprolol to control rhythm, then she developed resp failure, intubated and then asystole, coded with quick response, then again developed cardiac arrest.  On going treatment.  Enlarged heart on CXR, quick bedside echo with EF poss 15%.   Cecilie Kicks  Nurse Practitioner Certified [place] Pager [phone] or after 5pm or weekends call [phone] Jan 14, 2014, 10:57 AM    The patient was seen, examined  and discussed with Cecilie Kicks, NP and I agree with the above.   Mr Fettes is an unfortunate 33 year old female with h/o molar pregnancy in 2012 followed by oncology and a successful pregnancy in 2014, with delivery by C-section in September 2014. Based on notes her pregnancy and postpartum period was uneventful. She started to complain of SOB, fatigue and feeling warm yesterday. She vomited and had diarrhea overnight and was brought to the ER by her husband this morning with acute SOB>  She was found to be in SVT with HR 170 BPM, for which she received iv Adenosine and 2.5 mg iv Metoprolol. Shortly after she required intubation for an acute respiratory failure. This was followed by bradycardia and asystole and short CPR. Afterwards she developed three episodes of PEA with underlying sinus tachycardia. A prolonged CPR was performed in the ER (>30 minutes). She was started on Dopamin and Levophed drip and transferred to MICU.  ECG showed diffused ST depressions.  An echocardiogram with normal heart rate showed hyperdynamic LV EF with normal diastolic function and normal RV function.  TSH low, suggesting possible hyperthyroid storm. We recommened to treat with PTU, iv hydrocortisone and iv iodine. Stat endocrine consult.  For now aggressive treatment of sepsis, beta blockers once BP allows.   We will follow closely.   Dorothy Spark 14-Jan-2014

## 2014-01-10 NOTE — Procedures (Signed)
Hemodialysis Catheter Insertion Procedure Note [name] [id number] May 03, 1981  Procedure: Insertion of HD Venous Catheter Indications: CVVH.  Procedure Details Consent: Unable to obtain consent because of emergent medical necessity. Time Out: Verified patient identification, verified procedure, site/side was marked, verified correct patient position, special equipment/implants available, medications/allergies/relevent history reviewed, required imaging and test results available.  Performed  Maximum sterile technique was used including antiseptics, cap, gloves, gown, hand hygiene, mask and sheet. Skin prep: Chlorhexidine; local anesthetic administered A antimicrobial bonded/coated triple lumen catheter was placed in the right internal jugular vein using the Seldinger technique.  Evaluation Blood flow good Complications: No apparent complications Patient did tolerate procedure well. Chest X-ray ordered to verify placement.  CXR: pending.  Procedure performed under direct US guidance.   Montey Hora, PA - [facility] Pgr: [phone]  or [phone]   Attending:  I was present in the patient's room supervising the entire procedure.  Roselie Awkward, MD [facility] Pager: [phone] Cell: [phone] If no response, call [phone]

## 2014-01-10 NOTE — ED Notes (Signed)
Temp foley placed; 2 amps bicard; amp calcium given. Pulses intact

## 2014-01-10 NOTE — ED Notes (Signed)
Pt shocked at 150

## 2014-01-10 NOTE — Progress Notes (Signed)
Pharmacy Consult - Antibiotics  Antibiotics adjusted for CVVHD  Plan: 1) Zosyn 3.375 grams iv Q 6 hours 2) Levaquin 250 mg iv Q 24 hours 3) Vancomycin 500 mg iv Q 24 hours  Heparin now stopped.  Thank you. Anette Guarneri, PharmD [phone]

## 2014-01-10 NOTE — ED Notes (Signed)
MD at bedside talking to family

## 2014-01-10 NOTE — H&P (Signed)
PULMONARY / CRITICAL CARE MEDICINE   Name: [name] MRN: [id number] DOB: [birth year]    ADMISSION DATE:  01/19/2014  REFERRING MD :  Wilson Singer  CHIEF COMPLAINT:  Short of breath  BRIEF PATIENT DESCRIPTION:  16 [name] with n/v/d, feverish, and dyspnea for 1 day.  In SVT in ER.  Developed VDRF, and cardiac arrest in ER.    SIGNIFICANT EVENTS: 4/21 Admit, VDRF, Cardiac arrest  STUDIES:    LINES / TUBES: ETT 4/21 >> Lt femoral Aline 4/21 >> Rt femoral CVL 4/21 >>   CULTURES: Blood 4/21 >> Urine 4/21 >> Sputum 4/21 >>  ANTIBIOTICS: Levaquin 4/21 >> Vancomycin 4/21 >> Zosyn 4/21 >>   HISTORY OF PRESENT ILLNESS:   [name] with n/v/d, feverish, and dyspnea for 1 day.  In SVT in ER.  Developed VDRF, and cardiac arrest in ER.    When she presented to ER she was in SVT.  She was given adenosine, metoprolol, and cardioversion.  She required intubation.  She then developed cardiac arrest x 4 in ER.  She was noted to have hypoglycemia, metabolic acidosis.  Bedside Echo by cardiology showed severe LV dysfunction, but normal appearing RV systolic function.  PAST MEDICAL HISTORY :  Past Medical History  Diagnosis Date  . Hydatidiform mole     hydatidiform dx 5/12  . Abnormal Pap smear 2012    Colpo done by AVS;Last pap 08/2012;was normal  . 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 Portacath placement    . Port-a-cath removal  06/07/2012  . Dilation and evacuation  2012    w/ AVS d/t molar pregnancy  . Cleft lip repair  1984  . Cesarean section N/A 05/25/2013    Procedure: CESAREAN SECTION;  Surgeon: Maeola Sarah. Landry Mellow, MD;  Location: [facility];  Service: Obstetrics;  Laterality: N/A;   Prior to Admission medications   Medication Sig Start Date End Date Taking? Authorizing Provider  acetaminophen (TYLENOL) 325 MG tablet Take 325 mg by mouth every 6 (six) hours as needed for pain (headache).    Historical Provider, MD   amoxicillin (AMOXIL) 875 MG tablet Take 1 tablet (875 mg total) by mouth 2 (two) times daily. 09/17/13   Leandrew Koyanagi, MD  ibuprofen (ADVIL,MOTRIN) 600 MG tablet Take 1 tablet (600 mg total) by mouth every 6 (six) hours as needed for pain. 05/28/13   Donnel Saxon, CNM  ondansetron (ZOFRAN) 4 MG tablet Take 1 tablet (4 mg total) by mouth every 8 (eight) hours as needed for nausea or vomiting. 09/17/13   Leandrew Koyanagi, MD  Prenatal Vit-Fe Fumarate-FA (PRENATAL MULTIVITAMIN) TABS tablet Take 1 tablet by mouth daily at 12 noon.    Historical Provider, MD   No Known Allergies  FAMILY HISTORY:  Family History  Problem Relation Age of Onset  . Hyperlipidemia Mother    SOCIAL HISTORY:  reports that she has never smoked. She has never used smokeless tobacco. She reports that she does not drink alcohol or use illicit drugs.  REVIEW OF SYSTEMS:   Unable to obtain.  SUBJECTIVE:   VITAL SIGNS: Temp:  [97.8 F (36.6 C)] 97.8 F (36.6 C) (04/21 1004) Pulse Rate:  [49-169] 154 (04/21 1137) Resp:  [18-58] 24 (04/21 1137) BP: (61-180)/(10-95) 121/95 mmHg (04/21 1137) SpO2:  [55 %-100 %] 65 % (04/21 1137) HEMODYNAMICS:   VENTILATOR SETTINGS:   INTAKE / OUTPUT: Intake/Output   None  PHYSICAL EXAMINATION: General:  Ill appearing Neuro:  Comatose HEENT:  ETT in place, pupils dilated Cardiovascular:  Regular, tachycardic, weak pulses Lungs:  B/l rales Abdomen:  Soft, absent bowel sounds, no masses Musculoskeletal:  No edema Skin:  No rashes  LABS:  CBC  Recent Labs Lab 01/05/2014 1032  WBC PENDING  HGB 10.6*  HCT 34.5*  PLT PENDING   Coag's No results found for this basename: APTT, INR,  in the last 168 hours  BMET  Recent Labs Lab 12/24/2013 1032  NA 142  K 3.3*  CL 108  CO2 <7*  BUN 16  CREATININE 0.55  GLUCOSE 82   Electrolytes  Recent Labs Lab 01/09/2014 1032  CALCIUM 8.2*  MG 1.9  PHOS 5.9*   Sepsis Markers No results found for this basename:  LATICACIDVEN, PROCALCITON, O2SATVEN,  in the last 168 hours  ABG  Recent Labs Lab 12/29/2013 1137  PHART 6.902*  PCO2ART 36.3  PO2ART 54.0*   Liver Enzymes  Recent Labs Lab 01/09/2014 1032  AST 52*  ALT 23  ALKPHOS 156*  BILITOT 1.3*  ALBUMIN 2.2*   Cardiac Enzymes No results found for this basename: TROPONINI, PROBNP,  in the last 168 hours  Glucose  Recent Labs Lab 01/08/2014 1121  GLUCAP 44*    Imaging Dg Chest Portable 1 View  01/18/2014   CLINICAL DATA:  Intubation  EXAM: PORTABLE CHEST - 1 VIEW  COMPARISON:  11/17/2011  FINDINGS: Endotracheal tube is 15 mm above the carina. Numerous EKG leads overlying the chest.  Left lower lobe airspace disease consistent with pneumonia or atelectasis. No effusion. Right lung is clear.  IMPRESSION: Extensive left lower lobe airspace disease which may represent pneumonia or atelectasis. Endotracheal tube 15 mm above the carina.   Electronically Signed   By: Franchot Gallo M.D.   On: 01/09/2014 11:29    ASSESSMENT / PLAN:  PULMONARY A: Acute respiratory failure in setting of cardiac arrest.  Possible aspiration pneumonia. P:   F/u vent support F/u CXR and ABG  CARDIOVASCULAR A:  Cardiac arrest 4/21. Acute systolic heart failure. SVT. Shock ?cardiogenic vs septic. P:  Cardiology consulted Heparin gtt  Doppler legs F/u cardiac enzymes Defer hypothermia for now Dopamine gtt to keep SBP > 90, MAP > 65, HR > 60  RENAL A:   Metabolic acidosis. P:   Bicarbonate infusion F/u BMET, ABG Monitor renal fx, urine outpt  GASTROINTESTINAL A:   Nutrition. P:   NPO Protonix for SUP Tube feeds if unable to extubate soon  HEMATOLOGIC A:   Anemia ?hx of thalassemia. 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 P:  F/u CBC  INFECTIOUS A:   ?septic shock with possible aspiration pneumonia. P:   Empiric vancomycin, zosyn, levaquin pending further test results F/u  procalcitonin  ENDOCRINE A:   Hypoglycemia.   P:   Dextrose in IV fluids Check TSH, cortisol Monitor CBG's  NEUROLOGIC A:   Acute encephalopathy. 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  Updated husband at bedside.  CC time 90 minutes.  Chesley Mires, MD [facility] Pulmonary/Critical Care 01/02/2014, 12:07 PM Pager:  [phone] After 3pm call: [phone]

## 2014-01-10 NOTE — Progress Notes (Signed)
CPR Note  Ms. Naron had a brief cardiac arrest at 1907 this evening. PEA arrest Received CPR for about 90 seconds Received epinephrine x1 Regained pulse Started epinephrine drip  Husband updated.  Roselie Awkward, MD [place] P[facility]M Pager: [phone] Cell: [phone] If no response, call [phone]

## 2014-01-10 NOTE — Progress Notes (Signed)
Chaplain requested to provide support to family in the Emergency Department. Patient's husband is Buddhist, he and patient's parents are from [place]. He speaks good [place], but he said that patients' parents do not. Patient has 20 month old at home. Chaplain provided emotional support through empathic listening, compassionate presence, and refreshments, acted as Dentist between husband and staff, and referred to unit chaplain for additional follow up.   Ethelene Browns [phone]

## 2014-01-10 NOTE — ED Notes (Signed)
N,V,D x 2 days. Cancer pt; no chemo; HR 329'V; BP systolic 70.

## 2014-01-10 NOTE — Progress Notes (Signed)
Post ABG, Dr. Lake Bells increased peep to 8

## 2014-01-10 NOTE — ED Notes (Signed)
MD at bedside. 

## 2014-01-10 NOTE — ED Notes (Signed)
Pt shocked at 100 J

## 2014-01-10 NOTE — ED Notes (Signed)
A line in place

## 2014-01-10 NOTE — Progress Notes (Signed)
Chaplain went to pt and room and husband was not present.  Chaplain found pt husband in waiting room.  Husband is buddhist, and told chaplain that he will be contacting his faith representative later in the day.  He appears to be optimistic regarding his wife's condition, but is worried that he might lose his job.  Apparently, he works in Psychologist, educational and he might not meet his hourly requirement according to how he understands his job responsibility.  Husband was on his way home and waiting for his father to pick him up, as he has a new born baby at home.  Grandfather came shortly thereafter and they left.

## 2014-01-10 NOTE — ED Notes (Addendum)
Compressions stopped; no femoral pulse felt. Compressions resumed

## 2014-01-10 NOTE — Procedures (Signed)
Arterial Catheter Insertion Procedure Note Akiko Schexnider [id number] 15-Feb-1981  Procedure: Insertion of Arterial Catheter  Indications: Blood pressure monitoring and Frequent blood sampling  Procedure Details Consent: Unable to obtain consent because of emergent medical necessity. Time Out: Verified patient identification, verified procedure, site/side was marked, verified correct patient position, special equipment/implants available, medications/allergies/relevent history reviewed, required imaging and test results available.  Performed  Maximum sterile technique was used including antiseptics, cap, gloves, gown, hand hygiene, mask and sheet. Skin prep: Chlorhexidine; local anesthetic administered 20 gauge catheter was inserted into left radial artery using the Seldinger technique.  Evaluation Blood flow good; BP tracing good. Complications: No apparent complications.   Richardson Landry Minor ACNP [facility] Pager [phone] till 3 pm If no answer page [phone] 01/12/2014, 11:56 AM   I was present for procedure.  Chesley Mires, MD [facility] Pulmonary/Critical Care 01/13/2014, 12:09 PM Pager:  [phone] After 3pm call: [phone]

## 2014-01-10 NOTE — Progress Notes (Signed)
[facility]  Labs reviewed, clear thyroid storm Shock has worsened, she now has refractory shock Hypertonic from numerous bicarb pushes used throughout the last several hours (only thing that would help her BP). CVVHD has been going for several hours Now 2+ hours beyond first dose of methimazole.  I have explained to the family that there is nothing more we can offer and that she will die.  They were tearful and voiced understanding.  I have instructed the nurses to not escalate care and not replace the vasopressors as they run out.  Explained to family that she will die in hours.  Roselie Awkward, MD [place] [facility] Pager: [phone] Cell: [phone] If no response, call [phone]

## 2014-01-10 NOTE — Progress Notes (Signed)
CRITICAL VALUE ALERT  Critical value received:  Troponin   Date of notification:  01/13/2014  Time of notification:  3:05  Critical value read back:yes  Nurse who received alert:  Sanjuana Kava, RN  MD notified (1st page):  Dr. Lake Bells, present on unit at time of notification  Time of first page:  3:05  MD notified (2nd page):  Time of second page:  Responding MD:  Dr. Lake Bells  Time MD responded:  3:05

## 2014-01-10 NOTE — ED Notes (Signed)
Phleb unable to get blood.

## 2014-01-10 NOTE — ED Notes (Signed)
Family at bedside. 

## 2014-01-10 NOTE — Progress Notes (Signed)
UR Completed.  Vaibhav Fogleman Jane Sedra Morfin [phone] 01/07/2014  

## 2014-01-10 NOTE — Progress Notes (Signed)
**Note De-Identified  Obfuscation** RT note. ECO2 monitored during CPR

## 2014-01-10 NOTE — ED Notes (Signed)
Pt shocked at 200

## 2014-01-10 NOTE — ED Notes (Addendum)
cbg 40. D50 given. Portable xray at bedside.

## 2014-01-10 NOTE — Progress Notes (Signed)
[facility]  I have been in the ICU guiding resuscitation since 1500 this afternoon. She has been very responsive to Sodium Bicarbonate pushes so we have asked our nephrology colleagues to start CVVHD.  Catheter placed. She has been profoundly tachycardic and at times has been in Afib with RVR.  I attemped synchronized cardioversion: 2x 150J biphasic, once 200J;  All three attempts were unsuccessful.  Have not been able to use rate control or amiodarone due to shock and thyroid issues respectively.  Husband updated by phone with translator.  Additional CC time by me outside of procedures has been 60 minutes  Roselie Awkward, MD [place] Pager: [phone] Cell: [phone] If no response, call [phone]

## 2014-01-10 NOTE — ED Notes (Signed)
16 OG tube placed with bloody emesis coming out when connected to suction.

## 2014-01-10 NOTE — Progress Notes (Signed)
Chaplain responded to code blue. Pt's husband present but not receptive to emotional support at this time. Chaplain available for follow-up if family's needs change.

## 2014-01-10 NOTE — ED Notes (Signed)
Pt continues to be in SVT; pale.

## 2014-01-10 NOTE — ED Notes (Signed)
Pt moved to TC.

## 2014-01-10 NOTE — Progress Notes (Signed)
ANTICOAGULATION CONSULT NOTE - Initial Consult  Pharmacy Consult for Heparin Indication: chest pain/ACS  No Known Allergies  Patient Measurements:   Wt Readings from Last 3 Encounters:  11/17/13 100 lb (45.36 kg)  09/17/13 105 lb (47.628 kg)  07/07/13 122 lb 14.4 oz (55.747 kg)     Vital Signs: Temp: 97.8 F (36.6 C) (04/21 1004) Temp src: Oral (04/21 1004) BP: 61/30 mmHg (04/21 1121) Pulse Rate: 136 (04/21 1115)  Labs:  Recent Labs  12/22/2013 1032  HGB 10.6*  HCT 34.5*  PLT PENDING    The CrCl is unknown because both a height and weight (above a minimum accepted value) are required for this calculation.   Medical History: Past Medical History  Diagnosis Date  . Hydatidiform mole     hydatidiform dx 5/12  . Abnormal Pap smear 2012    Colpo done by AVS;Last pap 08/2012;was normal  . 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 piperacillin-tazobactam    . vancomycin      Assessment: 33 year old female s/p cardiac arrest now to begin anticoagulation with Heparin for possible ACS.  Goal of Therapy:  Heparin level 0.3-0.7 units/ml Monitor platelets by anticoagulation protocol: Yes   Plan:  Heparin 2500 units IV bolus Start Heparin infusion at 500 units/hr Check Heparin level in 6 hours Daily Heparin level and CBC  Legrand Como, Pharm.D., BCPS, AAHIVP Clinical Pharmacist Phone [phone] or [phone] 12/28/2013, 11:43 AM    Norva Riffle 01/13/2014,11:41 AM

## 2014-01-10 NOTE — Procedures (Signed)
Central Venous Catheter Insertion Procedure Note [name] [id number] 04-11-1981  Procedure: Insertion of Central Venous Catheter Indications: Assessment of intravascular volume and Drug and/or fluid administration  Procedure Details Consent: Unable to obtain consent because of emergent medical necessity. Time Out: Verified patient identification, verified procedure, site/side was marked, verified correct patient position, special equipment/implants available, medications/allergies/relevent history reviewed, required imaging and test results available.  Performed  Maximum sterile technique was used including antiseptics, cap, gloves, gown, hand hygiene, mask and sheet. Skin prep: Chlorhexidine; local anesthetic administered A antimicrobial bonded/coated triple lumen catheter was placed in the left internal jugular vein using the Seldinger technique.  Ultrasound was used to verify the patency of the vein and for real time needle guidance.  Evaluation Blood flow good Complications: No apparent complications Patient did tolerate procedure well. Chest X-ray ordered to verify placement.  CXR: normal.  Juanito Doom 01/19/14, 4:30 PM

## 2014-01-10 NOTE — Progress Notes (Addendum)
ANTIBIOTIC CONSULT NOTE - INITIAL  Pharmacy Consult for Vancomycin Indication: rule out sepsis  No Known Allergies  Patient Measurements:   Wt Readings from Last 3 Encounters:  11/17/13 100 lb (45.36 kg)  09/17/13 105 lb (47.628 kg)  07/07/13 122 lb 14.4 oz (55.747 kg)    Vital Signs: Temp: 97.8 F (36.6 C) (04/21 1004) Temp src: Oral (04/21 1004) BP: 87/40 mmHg (04/21 1025) Pulse Rate: 156 (04/21 1025) Intake/Output from previous day:   Intake/Output from this shift:    Labs: No results found for this basename: WBC, HGB, PLT, LABCREA, CREATININE,  in the last 72 hours The CrCl is unknown because both a height and weight (above a minimum accepted value) are required for this calculation. No results found for this basename: VANCOTROUGH, VANCOPEAK, VANCORANDOM, GENTTROUGH, GENTPEAK, GENTRANDOM, TOBRATROUGH, TOBRAPEAK, TOBRARND, AMIKACINPEAK, AMIKACINTROU, AMIKACIN,  in the last 72 hours   Microbiology: No results found for this or any previous visit (from the past 720 hour(s)).  Medical History: Past Medical History  Diagnosis Date  . Hydatidiform mole     hydatidiform dx 5/12  . Abnormal Pap smear 2012    Colpo done by AVS;Last pap 08/2012;was normal  . 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  She is to begin empiric antibiotic therapy with Vancomycin and Zosyn.  Note her serum creatinine is not available at this time.  Goal of Therapy:  Vancomycin trough level 15-20 mcg/ml  Plan:  Give Zosyn 3.375gm IV x 1 dose Give Vancomycin 1gm IV x 1 dose  Follow-up labs when available to determine maintenance doses.  Legrand Como, Pharm.D., BCPS,  AAHIVP Clinical Pharmacist Phone [phone] or [phone] 12/30/2013, 10:38 AM   SCr 0.6. To add Levaquin empirically as well per CCM.  Plan: Vancomycin 500mg  IV q8h Zosyn 3.375gm IV q8h extended infusion Levaquin 750mg  IV q24h Watch renal function closely  Legrand Como, Pharm.D., BCPS, AAHIVP Clinical Pharmacist Phone [phone] or [phone] 01/10/2014, 11:47 AM

## 2014-01-10 NOTE — ED Notes (Signed)
No pulses felt; 1 epi and bicarb given.

## 2014-01-10 NOTE — ED Notes (Addendum)
5th L NS hung

## 2014-01-10 NOTE — Consult Note (Signed)
Reason for Consult: Low TSH, possible thyroid storm. Referring Physician: Dr. Ophelia Charter [name] is an 33 y.o. female.   History of present illness:  [name] is not able to provide a history at this time due to her current mental status.    Past Medical History  Diagnosis Date  . Hydatidiform mole     hydatidiform dx 5/12  . Abnormal Pap smear 2012    Colpo done by AVS;Last pap 08/2012;was normal  . 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 Portacath placement    . Port-a-cath removal  06/07/2012  . Dilation and evacuation  2012    w/ AVS d/t molar pregnancy  . Cleft lip repair  1984  . Cesarean section N/A 05/25/2013    Procedure: CESAREAN SECTION;  Surgeon: Maeola Sarah. Landry Mellow, MD;  Location: [facility];  Service: Obstetrics;  Laterality: N/A;    Family History  Problem Relation Age of Onset  . Hyperlipidemia Mother     Social History:  reports that she has never smoked. She has never used smokeless tobacco. She reports that she does not drink alcohol or use illicit drugs.  Allergies: No Known Allergies  Medications: I have reviewed the patient's current medications.  Results for orders placed during the hospital encounter of 01/16/2014 (from the past 48 hour(s))  CBC WITH DIFFERENTIAL     Status: Abnormal   Collection Time    01/04/2014 10:32 AM      Result Value Ref Range   WBC 22.1 (*) 4.0 - 10.5 K/uL   Comment: WHITE COUNT CONFIRMED ON SMEAR   RBC 4.30  3.87 - 5.11 MIL/uL   Hemoglobin 10.6 (*) 12.0 - 15.0 g/dL   HCT 34.5 (*) 36.0 - 46.0 %   MCV 80.2  78.0 - 100.0 fL   MCH 24.7 (*) 26.0 - 34.0 pg   MCHC 30.7  30.0 - 36.0 g/dL   RDW 14.3  11.5 - 15.5 %   Platelets 193  150 - 400 K/uL   Comment: PLATELET COUNT CONFIRMED BY SMEAR   Neutrophils Relative % 83 (*) 43 - 77 %   Lymphocytes Relative 9 (*) 12 - 46 %   Monocytes Relative 8  3 - 12 %   Eosinophils Relative 0  0 - 5 %   Basophils Relative 0   0 - 1 %   Neutro Abs 18.3 (*) 1.7 - 7.7 K/uL   Lymphs Abs 2.0  0.7 - 4.0 K/uL   Monocytes Absolute 1.8 (*) 0.1 - 1.0 K/uL   Eosinophils Absolute 0.0  0.0 - 0.7 K/uL   Basophils Absolute 0.0  0.0 - 0.1 K/uL   RBC Morphology POLYCHROMASIA PRESENT     Comment: BURR CELLS   WBC Morphology INCREASED BANDS (>20% BANDS)     Comment: MILD LEFT SHIFT (1-5% METAS, OCC MYELO, OCC BANDS)     DOHLE BODIES  COMPREHENSIVE METABOLIC PANEL     Status: Abnormal   Collection Time    01/01/2014 10:32 AM      Result Value Ref Range   Sodium 142  137 - 147 mEq/L   Potassium 3.3 (*) 3.7 - 5.3 mEq/L   Chloride 108  96 - 112 mEq/L   CO2 <7 (*) 19 - 32 mEq/L   Comment: CRITICAL RESULT CALLED TO, READ BACK BY AND VERIFIED WITH:     Mali GROSE,RN AT 1140 01/01/2014 BY ZBEECH.  Glucose, Bld 82  70 - 99 mg/dL   BUN 16  6 - 23 mg/dL   Creatinine, Ser 0.55  0.50 - 1.10 mg/dL   Calcium 8.2 (*) 8.4 - 10.5 mg/dL   Total Protein 5.3 (*) 6.0 - 8.3 g/dL   Albumin 2.2 (*) 3.5 - 5.2 g/dL   AST 52 (*) 0 - 37 U/L   ALT 23  0 - 35 U/L   Alkaline Phosphatase 156 (*) 39 - 117 U/L   Total Bilirubin 1.3 (*) 0.3 - 1.2 mg/dL   GFR calc non Af Amer >90  >90 mL/min   GFR calc Af Amer >90  >90 mL/min   Comment: (NOTE)     The eGFR has been calculated using the CKD EPI equation.     This calculation has not been validated in all clinical situations.     eGFR's persistently <90 mL/min signify possible Chronic Kidney     Disease.  MAGNESIUM     Status: None   Collection Time    01/08/2014 10:32 AM      Result Value Ref Range   Magnesium 1.9  1.5 - 2.5 mg/dL  PHOSPHORUS     Status: Abnormal   Collection Time    01/13/2014 10:32 AM      Result Value Ref Range   Phosphorus 5.9 (*) 2.3 - 4.6 mg/dL  TROPONIN I     Status: None   Collection Time    01/19/2014 10:32 AM      Result Value Ref Range   Troponin I <0.30  <0.30 ng/mL   Comment:            Due to the release kinetics of cTnI,     a negative result within the first hours      of the onset of symptoms does not rule out     myocardial infarction with certainty.     If myocardial infarction is still suspected,     repeat the test at appropriate intervals.  APTT     Status: Abnormal   Collection Time    01/06/2014 11:15 AM      Result Value Ref Range   aPTT 58 (*) 24 - 37 seconds   Comment:            IF BASELINE aPTT IS ELEVATED,     SUGGEST PATIENT RISK ASSESSMENT     BE USED TO DETERMINE APPROPRIATE     ANTICOAGULANT THERAPY.  PROTIME-INR     Status: Abnormal   Collection Time    01/08/2014 11:15 AM      Result Value Ref Range   Prothrombin Time 26.1 (*) 11.6 - 15.2 seconds   INR 2.49 (*) 0.00 - 1.49  CBG MONITORING, ED     Status: Abnormal   Collection Time    12/21/2013 11:21 AM      Result Value Ref Range   Glucose-Capillary 44 (*) 70 - 99 mg/dL  I-STAT ARTERIAL BLOOD GAS, ED     Status: Abnormal   Collection Time    12/30/2013 11:37 AM      Result Value Ref Range   pH, Arterial 6.902 (*) 7.350 - 7.450   pCO2 arterial 36.3  35.0 - 45.0 mmHg   pO2, Arterial 54.0 (*) 80.0 - 100.0 mmHg   Bicarbonate 7.2 (*) 20.0 - 24.0 mEq/L   TCO2 8  0 - 100 mmol/L   O2 Saturation 64.0     Acid-base deficit 24.0 (*) 0.0 - 2.0  mmol/L   Patient temperature 98.0 F     Collection site FEMORAL ARTERY     Drawn by Nurse     Sample type ARTERIAL     Comment NOTIFIED PHYSICIAN    COMPREHENSIVE METABOLIC PANEL     Status: Abnormal   Collection Time    01/05/2014 11:42 AM      Result Value Ref Range   Sodium 150 (*) 137 - 147 mEq/L   Potassium <2.2 (*) 3.7 - 5.3 mEq/L   Comment: REPEATED TO VERIFY     CRITICAL RESULT CALLED TO, READ BACK BY AND VERIFIED WITH:     CRYSTAL MANES,RN AT 1510 01/19/2014 BY ZBEECH.   Chloride 113 (*) 96 - 112 mEq/L   CO2 10 (*) 19 - 32 mEq/L   Comment: CRITICAL RESULT CALLED TO, READ BACK BY AND VERIFIED WITH:     CRYSTAL MANES,RN AT 1510 01/07/2014 BY ZBEECH.   Glucose, Bld 58 (*) 70 - 99 mg/dL   BUN 15  6 - 23 mg/dL   Creatinine, Ser 0.55   0.50 - 1.10 mg/dL   Calcium 8.4  8.4 - 10.5 mg/dL   Total Protein 4.1 (*) 6.0 - 8.3 g/dL   Albumin 1.6 (*) 3.5 - 5.2 g/dL   AST 476 (*) 0 - 37 U/L   ALT 173 (*) 0 - 35 U/L   Alkaline Phosphatase 179 (*) 39 - 117 U/L   Total Bilirubin 1.4 (*) 0.3 - 1.2 mg/dL   GFR calc non Af Amer >90  >90 mL/min   GFR calc Af Amer >90  >90 mL/min   Comment: (NOTE)     The eGFR has been calculated using the CKD EPI equation.     This calculation has not been validated in all clinical situations.     eGFR's persistently <90 mL/min signify possible Chronic Kidney     Disease.  MAGNESIUM     Status: None   Collection Time    01/09/2014 11:42 AM      Result Value Ref Range   Magnesium 2.0  1.5 - 2.5 mg/dL  PHOSPHORUS     Status: Abnormal   Collection Time    01/17/2014 11:42 AM      Result Value Ref Range   Phosphorus 8.5 (*) 2.3 - 4.6 mg/dL  CORTISOL     Status: None   Collection Time    12/31/2013 11:43 AM      Result Value Ref Range   Cortisol, Plasma 114.7     Comment: (NOTE)     Result confirmed by automatic dilution.     AM:  4.3 - 22.4 ug/dL     PM:  3.1 - 16.7 ug/dL     Performed at [facility]  TROPONIN I     Status: Abnormal   Collection Time    01/02/2014 11:46 AM      Result Value Ref Range   Troponin I 0.96 (*) <0.30 ng/mL   Comment:            Due to the release kinetics of cTnI,     a negative result within the first hours     of the onset of symptoms does not rule out     myocardial infarction with certainty.     If myocardial infarction is still suspected,     repeat the test at appropriate intervals.     CRITICAL RESULT CALLED TO, READ BACK BY AND VERIFIED WITH:     CRYSTAL  MANES,RN AT 1505 12/30/2013 BY ZBEECH.  TSH     Status: Abnormal   Collection Time    01/03/2014 11:53 AM      Result Value Ref Range   TSH 0.005 (*) 0.350 - 4.500 uIU/mL   Comment: Please note change in reference range.  LACTIC ACID, PLASMA     Status: Abnormal   Collection Time    12/27/2013 11:53  AM      Result Value Ref Range   Lactic Acid, Venous 19.4 (*) 0.5 - 2.2 mmol/L  I-STAT ARTERIAL BLOOD GAS, ED     Status: Abnormal   Collection Time    12/21/2013 12:04 PM      Result Value Ref Range   pH, Arterial 6.913 (*) 7.350 - 7.450   pCO2 arterial 44.0  35.0 - 45.0 mmHg   pO2, Arterial 98.0  80.0 - 100.0 mmHg   Bicarbonate 9.1 (*) 20.0 - 24.0 mEq/L   TCO2 10  0 - 100 mmol/L   O2 Saturation 92.0     Acid-base deficit 21.0 (*) 0.0 - 2.0 mmol/L   Patient temperature 96.3 F     Collection site ARTERIAL LINE     Drawn by Operator     Sample type ARTERIAL     Comment NOTIFIED PHYSICIAN    PROCALCITONIN     Status: None   Collection Time    01/13/2014 12:08 PM      Result Value Ref Range   Procalcitonin 119.43     Comment:            Interpretation:     PCT >= 10 ng/mL:     Important systemic inflammatory response,     almost exclusively due to severe bacterial     sepsis or septic shock.     (NOTE)             ICU PCT Algorithm               Non ICU PCT Algorithm        ----------------------------     ------------------------------             PCT < 0.25 ng/mL                 PCT < 0.1 ng/mL         Stopping of antibiotics            Stopping of antibiotics           strongly encouraged.               strongly encouraged.        ----------------------------     ------------------------------           PCT level decrease by               PCT < 0.25 ng/mL           >= 80% from peak PCT           OR PCT 0.25 - 0.5 ng/mL          Stopping of antibiotics                                                 encouraged.         Stopping of antibiotics  encouraged.        ----------------------------     ------------------------------           PCT level decrease by              PCT >= 0.25 ng/mL           < 80% from peak PCT            AND PCT >= 0.5 ng/mL            Continuing antibiotics                                                  encouraged.           Continuing  antibiotics                encouraged.        ----------------------------     ------------------------------         PCT level increase compared          PCT > 0.5 ng/mL             with peak PCT AND              PCT >= 0.5 ng/mL             Escalation of antibiotics                                              strongly encouraged.          Escalation of antibiotics            strongly encouraged.  MRSA PCR SCREENING     Status: Abnormal   Collection Time    12/23/2013  1:06 PM      Result Value Ref Range   MRSA by PCR POSITIVE (*) NEGATIVE   Comment:            The GeneXpert MRSA Assay (FDA     approved for NASAL specimens     only), is one component of a     comprehensive MRSA colonization     surveillance program. It is not     intended to diagnose MRSA     infection nor to guide or     monitor treatment for     MRSA infections.     RESULT CALLED TO, READ BACK BY AND VERIFIED WITH:     C. MANUS 14:35 12/25/2013 ([email or web address])  DIC (DISSEMINATED INTRAVASCULAR COAGULATION) PANEL     Status: Abnormal   Collection Time    12/22/2013  3:04 PM      Result Value Ref Range   Prothrombin Time 24.6 (*) 11.6 - 15.2 seconds   INR 2.31 (*) 0.00 - 1.49   aPTT >200 (*) 24 - 37 seconds   Comment:            IF BASELINE aPTT IS ELEVATED,     SUGGEST PATIENT RISK ASSESSMENT     BE USED TO DETERMINE APPROPRIATE     ANTICOAGULANT THERAPY.     REPEATED TO VERIFY     CRITICAL RESULT CALLED TO, READ BACK BY AND VERIFIED WITH:     C MANUS,RN 1613 01/04/2014 D  BRADLEY   Fibrinogen 455  204 - 475 mg/dL   D-Dimer, Quant 2.53 (*) 0.00 - 0.48 ug/mL-FEU   Comment:            AT THE INHOUSE ESTABLISHED CUTOFF     VALUE OF 0.48 ug/mL FEU,     THIS ASSAY HAS BEEN DOCUMENTED     IN THE LITERATURE TO HAVE     A SENSITIVITY AND NEGATIVE     PREDICTIVE VALUE OF AT LEAST     98 TO 99%.  THE TEST RESULT     SHOULD BE CORRELATED WITH     AN ASSESSMENT OF THE CLINICAL     PROBABILITY OF DVT / VTE.   Platelets 221   150 - 400 K/uL   Smear Review NO SCHISTOCYTES SEEN    BASIC METABOLIC PANEL     Status: Abnormal   Collection Time    12/27/2013  3:11 PM      Result Value Ref Range   Sodium 148 (*) 137 - 147 mEq/L   Potassium <2.2 (*) 3.7 - 5.3 mEq/L   Comment: REPEATED TO VERIFY     CRITICAL RESULT CALLED TO, READ BACK BY AND VERIFIED WITH:     C MCKOEWN,RN 1607 01/03/2014 WBOND   Chloride 112  96 - 112 mEq/L   CO2 12 (*) 19 - 32 mEq/L   Glucose, Bld 88  70 - 99 mg/dL   BUN 16  6 - 23 mg/dL   Creatinine, Ser 0.57  0.50 - 1.10 mg/dL   Calcium 8.4  8.4 - 10.5 mg/dL   GFR calc non Af Amer >90  >90 mL/min   GFR calc Af Amer >90  >90 mL/min   Comment: (NOTE)     The eGFR has been calculated using the CKD EPI equation.     This calculation has not been validated in all clinical situations.     eGFR's persistently <90 mL/min signify possible Chronic Kidney     Disease.  TSH     Status: Abnormal   Collection Time    01/09/2014  3:19 PM      Result Value Ref Range   TSH 0.009 (*) 0.350 - 4.500 uIU/mL   Comment: Please note change in reference range.  POCT I-STAT 3, ART BLOOD GAS (G3+)     Status: Abnormal   Collection Time    01/03/2014  3:24 PM      Result Value Ref Range   pH, Arterial 7.024 (*) 7.350 - 7.450   pCO2 arterial 47.1 (*) 35.0 - 45.0 mmHg   pO2, Arterial 71.0 (*) 80.0 - 100.0 mmHg   Bicarbonate 12.3 (*) 20.0 - 24.0 mEq/L   TCO2 14  0 - 100 mmol/L   O2 Saturation 84.0     Acid-base deficit 18.0 (*) 0.0 - 2.0 mmol/L   Patient temperature 98.2 F     Collection site ARTERIAL LINE     Drawn by Operator     Sample type ARTERIAL     Comment NOTIFIED PHYSICIAN    GLUCOSE, CAPILLARY     Status: None   Collection Time    12/25/2013  4:03 PM      Result Value Ref Range   Glucose-Capillary 76  70 - 99 mg/dL  CARBOXYHEMOGLOBIN     Status: Abnormal   Collection Time    12/30/2013  5:05 PM      Result Value Ref Range   Total hemoglobin 10.2 (*) 12.0 - 16.0 g/dL  O2 Saturation 45.9      Carboxyhemoglobin 0.8  0.5 - 1.5 %   Methemoglobin 1.2  0.0 - 1.5 %  LACTIC ACID, PLASMA     Status: Abnormal   Collection Time    01/01/2014  5:12 PM      Result Value Ref Range   Lactic Acid, Venous 12.1 (*) 0.5 - 2.2 mmol/L    ROS Unable to obtain due to patient's current mental status.  Blood pressure 117/64, pulse 165, temperature 102.2 F (39 C), temperature source Core (Comment), resp. rate 35, height 4' 10"  (1.473 m), weight 45.4 kg (100 lb 1.4 oz), SpO2 94.00%.  Physical Exam General: unresponsive to physical or verbal stimuli. Eyes: Anicteric, no proptosis. Neck: Supple, trachea midline. Thyroid: no appreciable enlargement, but thyroid exam limited due to supine position of patient. Cardiovascular: rapid irregularly irregular, weak radial pulses. Respiratory: coarse lung sounds. Gastrointestinal: No bowel sounds, mild abdominal distention but no appreciable hepatomegaly. Neurologic: Deep tendon reflexes reveal diffuse hyporeflexia, no tremor. Musculoskeletal: No appreciable muscle atrophy. Skin: cool extremities, no visible rash. Mental status: unresponsive. Hematologic/lymphatic: No appreciable cervical adenopathy.   CT scan of abdomen 01/2011:  According to the radiologist, the adrenal glands appeared normal.    Assessment/Plan: 1.  Low TSH level. 2.  Atrial fibrillation with rapid ventricular rate. 3.  Elevated body temperature. 4.  Coma. 5.  Metastatic molar pregnancy.  Today I reviewed her laboratory reports, hospital notes, radiology reports, CT scan of abdomen images from 2012, CT scan of chest images from 2012, and ECG tracings from today.  I also spoke with the attending physician and internal medicine intern about my findings and recommendations.  No apparent thyroid nodules, goiter, or Graves' orbitopathy.  hCG from pregnancy or trophoblastic disease can stimulate the thyroid gland in a similar fashion as TSH.  Her initial presentation did not seem  consistent with thyroid storm.  However, I agree that her current findings are somewhat worrisome for thyroid storm.    Her random cortisol level was obtained prior to the first documented dose of corticosteroid therapy.  That cortisol level excluded central or primary adrenal insufficiency.  Recommendations: 1.  Check quantitative hCG level now.  2.  Review Free T4 and Free T3 levels when available.    If Free T4 and Free T3 are normal (or low), then this does not represent thyroid storm.    If Free T4 and/or Free T3 are elevated, then continue with methimazole and hydrocortisone.    Additional measures which might help with hyperthyroidism include:  1.  beta-blocker therapy (if blood pressure allows),  2.  Lugol's solution 4 to 8 drops by mouth every 6 to 8 hours (administer at least 1 hour after methimazole), 3.  Cholestyramine 4 grams by mouth up to 4 times a day (administer at least 4 hours apart from oral medicines for which absorption is diminished by cholestyramine).     Delrae Rend 01/02/2014, 6:03 PM

## 2014-01-10 NOTE — ED Notes (Signed)
Compressions continued; no pulse felt.

## 2014-01-10 NOTE — ED Notes (Addendum)
Bounding femoral pulses felt.

## 2014-01-10 NOTE — Progress Notes (Signed)
CRITICAL VALUE ALERT  Critical value received:  Ptt >200  Date of notification:  01/13/2014  Time of notification:  4:14  Critical value read back:yes  Nurse who received alert:  Sanjuana Kava, RN   MD notified (1st page):  Dr. Lake Bells, present on unit at time of notification  Time of first page:  4:14  MD notified (2nd page):   Time of second page:  Responding MD:  Dr. Lake Bells  Time MD responded:  4:14

## 2014-01-11 ENCOUNTER — Inpatient Hospital Stay (HOSPITAL_COMMUNITY): Payer: BC Managed Care – PPO

## 2014-01-11 DIAGNOSIS — I469 Cardiac arrest, cause unspecified: Secondary | ICD-10-CM

## 2014-01-11 DIAGNOSIS — J9600 Acute respiratory failure, unspecified whether with hypoxia or hypercapnia: Secondary | ICD-10-CM

## 2014-01-11 LAB — CBC WITH DIFFERENTIAL/PLATELET
Basophils Absolute: 0 10*3/uL (ref 0.0–0.1)
Basophils Relative: 0 % (ref 0–1)
Eosinophils Absolute: 0 10*3/uL (ref 0.0–0.7)
Eosinophils Relative: 0 % (ref 0–5)
HCT: 37.4 % (ref 36.0–46.0)
HEMOGLOBIN: 12.4 g/dL (ref 12.0–15.0)
LYMPHS PCT: 5 % — AB (ref 12–46)
Lymphs Abs: 1.7 10*3/uL (ref 0.7–4.0)
MCH: 26.1 pg (ref 26.0–34.0)
MCHC: 33.2 g/dL (ref 30.0–36.0)
MCV: 78.6 fL (ref 78.0–100.0)
Monocytes Absolute: 1 10*3/uL (ref 0.1–1.0)
Monocytes Relative: 3 % (ref 3–12)
NEUTROS PCT: 92 % — AB (ref 43–77)
Neutro Abs: 30.5 10*3/uL — ABNORMAL HIGH (ref 1.7–7.7)
Platelets: ADEQUATE 10*3/uL (ref 150–400)
RBC: 4.76 MIL/uL (ref 3.87–5.11)
RDW: 15.4 % (ref 11.5–15.5)
SMEAR REVIEW: ADEQUATE
WBC Morphology: INCREASED
WBC: 33.2 10*3/uL — AB (ref 4.0–10.5)

## 2014-01-11 LAB — PHOSPHORUS: Phosphorus: 5 mg/dL — ABNORMAL HIGH (ref 2.3–4.6)

## 2014-01-11 LAB — GLUCOSE, CAPILLARY
GLUCOSE-CAPILLARY: 62 mg/dL — AB (ref 70–99)
Glucose-Capillary: 124 mg/dL — ABNORMAL HIGH (ref 70–99)
Glucose-Capillary: 76 mg/dL (ref 70–99)
Glucose-Capillary: 87 mg/dL (ref 70–99)
Glucose-Capillary: 92 mg/dL (ref 70–99)

## 2014-01-11 LAB — COMPREHENSIVE METABOLIC PANEL
ALT: 613 U/L — AB (ref 0–35)
AST: 2562 U/L — ABNORMAL HIGH (ref 0–37)
Albumin: 1.6 g/dL — ABNORMAL LOW (ref 3.5–5.2)
Alkaline Phosphatase: 168 U/L — ABNORMAL HIGH (ref 39–117)
BUN: 11 mg/dL (ref 6–23)
CO2: 21 meq/L (ref 19–32)
Calcium: 6.2 mg/dL — CL (ref 8.4–10.5)
Chloride: 86 mEq/L — ABNORMAL LOW (ref 96–112)
Creatinine, Ser: 0.63 mg/dL (ref 0.50–1.10)
Glucose, Bld: 131 mg/dL — ABNORMAL HIGH (ref 70–99)
Potassium: 4.8 mEq/L (ref 3.7–5.3)
Sodium: 146 mEq/L (ref 137–147)
Total Bilirubin: 3.7 mg/dL — ABNORMAL HIGH (ref 0.3–1.2)
Total Protein: 4.1 g/dL — ABNORMAL LOW (ref 6.0–8.3)

## 2014-01-11 LAB — POCT I-STAT 3, ART BLOOD GAS (G3+)
Acid-base deficit: 3 mmol/L — ABNORMAL HIGH (ref 0.0–2.0)
BICARBONATE: 24.4 meq/L — AB (ref 20.0–24.0)
O2 Saturation: 87 %
PCO2 ART: 46.2 mmHg — AB (ref 35.0–45.0)
PH ART: 7.321 — AB (ref 7.350–7.450)
PO2 ART: 52 mmHg — AB (ref 80.0–100.0)
Patient temperature: 95.4
TCO2: 26 mmol/L (ref 0–100)

## 2014-01-11 LAB — MAGNESIUM: Magnesium: 1.6 mg/dL (ref 1.5–2.5)

## 2014-01-11 LAB — PATHOLOGIST SMEAR REVIEW

## 2014-01-11 LAB — APTT: aPTT: 56 seconds — ABNORMAL HIGH (ref 24–37)

## 2014-01-11 LAB — PROCALCITONIN: Procalcitonin: 83.47 ng/mL

## 2014-01-11 LAB — T3, FREE: T3, Free: 19 pg/mL — ABNORMAL HIGH (ref 2.3–4.2)

## 2014-01-11 LAB — PROTIME-INR
INR: 3.91 — ABNORMAL HIGH (ref 0.00–1.49)
PROTHROMBIN TIME: 36.8 s — AB (ref 11.6–15.2)

## 2014-01-11 LAB — T4, FREE: Free T4: 5.62 ng/dL — ABNORMAL HIGH (ref 0.80–1.80)

## 2014-01-11 LAB — THYROID PEROXIDASE ANTIBODY: Thyroperoxidase Ab SerPl-aCnc: 441 IU/mL — ABNORMAL HIGH (ref <35.0)

## 2014-01-11 LAB — LACTIC ACID, PLASMA: Lactic Acid, Venous: 24.8 mmol/L — ABNORMAL HIGH (ref 0.5–2.2)

## 2014-01-11 MED ORDER — DEXTROSE 50 % IV SOLN: 25.0000 mL | Freq: Once | INTRAVENOUS | Status: DC | PRN

## 2014-01-11 MED ORDER — STERILE WATER FOR IRRIGATION IR SOLN
Freq: Four times a day (QID) | Status: DC
  Administered 2014-01-11: 08:00:00 via TOPICAL
  Filled 2014-01-11 (×4): qty 100

## 2014-01-11 MED ORDER — DEXTROSE 50 % IV SOLN
INTRAVENOUS | Status: AC
  Administered 2014-01-11: 25 mL
  Filled 2014-01-11: qty 50

## 2014-01-11 MED FILL — Medication: Qty: 1 | Status: AC

## 2014-01-12 LAB — TYPE AND SCREEN
ABO/RH(D): B POS
ANTIBODY SCREEN: NEGATIVE
UNIT DIVISION: 0
Unit division: 0

## 2014-01-13 LAB — CULTURE, BLOOD (ROUTINE X 2)

## 2014-01-15 LAB — THYROID STIMULATING IMMUNOGLOBULIN: TSI: 362 % baseline — ABNORMAL HIGH (ref <140)

## 2014-01-16 NOTE — Discharge Summary (Signed)
PULMONARY / CRITICAL CARE MEDICINE  DEATH NOTE   Name: [name] MRN: [id number] DOB: 10-Aug-1981    ADMISSION DATE:  Jan 18, 2014 Date of Death: 20-Jan-2023  Cause of death: 1) Thyroid storm 2) Septic shock, gram positive bacteremia   HISTORY OF PRESENT ILLNESS:   78 female with n/v/d, feverish, and dyspnea for 1 day.  In SVT in ER.  Developed VDRF, and cardiac arrest in ER.    When she presented to ER she was in SVT.  She was given adenosine, metoprolol, and cardioversion.  She required intubation.  She then developed cardiac arrest x 4 in ER.  She was noted to have hypoglycemia, metabolic acidosis.  Bedside Echo by cardiology showed severe LV dysfunction, but normal appearing RV systolic function.  PAST MEDICAL HISTORY :  Past Medical History  Diagnosis Date  . Hydatidiform mole     hydatidiform dx 5/12  . Abnormal Pap smear 2012    Colpo done by AVS;Last pap 08/2012;was normal  . 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 Portacath placement    . Port-a-cath removal  06/07/2012  . Dilation and evacuation  2012    w/ AVS d/t molar pregnancy  . Cleft lip repair  1984  . Cesarean section N/A 05/25/2013    Procedure: CESAREAN SECTION;  Surgeon: Maeola Sarah. Landry Mellow, MD;  Location: [facility];  Service: Obstetrics;  Laterality: N/A;   Prior to Admission medications   Medication Sig Start Date End Date Taking? Authorizing Provider  acetaminophen (TYLENOL) 325 MG tablet Take 325 mg by mouth every 6 (six) hours as needed for pain (headache).    Historical Provider, MD  amoxicillin (AMOXIL) 875 MG tablet Take 1 tablet (875 mg total) by mouth 2 (two) times daily. 09/17/13   Leandrew Koyanagi, MD  ibuprofen (ADVIL,MOTRIN) 600 MG tablet Take 1 tablet (600 mg total) by mouth every 6 (six) hours as needed for pain. 05/28/13   Donnel Saxon, CNM  ondansetron (ZOFRAN) 4 MG tablet Take 1 tablet (4 mg total) by mouth every 8 (eight) hours as  needed for nausea or vomiting. 09/17/13   Leandrew Koyanagi, MD  Prenatal Vit-Fe Fumarate-FA (PRENATAL MULTIVITAMIN) TABS tablet Take 1 tablet by mouth daily at 12 noon.    Historical Provider, MD   No Known Allergies  FAMILY HISTORY:  Family History  Problem Relation Age of Onset  . Hyperlipidemia Mother    SOCIAL HISTORY:  reports that she has never smoked. She has never used smokeless tobacco. She reports that she does not drink alcohol or use illicit drugs.  REVIEW OF SYSTEMS:   Unable to obtain.  SUBJECTIVE:   VITAL SIGNS:   HEMODYNAMICS:   VENTILATOR SETTINGS:   INTAKE / OUTPUT: Intake/Output   None     PHYSICAL EXAMINATION: General:  Ill appearing Neuro:  Comatose HEENT:  ETT in place, pupils dilated Cardiovascular:  Regular, tachycardic, weak pulses Lungs:  B/l rales Abdomen:  Soft, absent bowel sounds, no masses Musculoskeletal:  No edema Skin:  No rashes  LABS:   Hospital Course  The patient underwent more than 30 minutes of CPR in the ED prior to admission to the Medical Intensive Care Unit.  After arrival to the ICU she was noted to be profoundly acidotic and in refractory shock.  An echocardiogram performed within an hour of arrival to the ICU surprisingly showed normal heart function.  About this time her initial lab  work returned showing a very low TSH level.  Considering the situation we assumed this was thyroid storm and started methimazole enemas while ordering confirmatory follow up labs and consulting endocrinology.  The follow up lab work confirmed this diagnosis and showed a T3/T4 which were more than 3 times the upper limit of normal.  Despite administering methimazole and lugols solution to try to treat the thyroid storm, the patient's condition continued to deteriorate throughout the day of admission.  We gave more IV crystalloid and added IV epinephrine to her vasopressors, but she continued to deteriorate. She did have another brief cardiac  arrest in the evening of 4/21.  We started CVVHD for refractory acidosis in the setting of oliguric renal failure.  We treated with broad spectrum antibiotics for septic shock.  We updated her family frequently using a translator phone service.  After several hours of aggressive resuscitation it became clear that the patient would not survive the illness.  After discussion with the family we elected to stop providing CPR but to continue full support measures otherwise.    She passed due to refractory shock on 4/22 in the AM with family at bedside.  Roselie Awkward, MD [facility] Pager: [phone] Cell: [phone] If no response, call [phone]

## 2014-01-20 NOTE — Progress Notes (Signed)
VASCULAR LAB PRELIMINARY  PRELIMINARY  PRELIMINARY  PRELIMINARY  Bilateral lower extremity venous duplex  completed.    Preliminary report:  Bilateral:  No evidence of DVT, superficial thrombosis, or Baker's Cyst.    Nani Ravens, RVT 12/25/2013, 10:09 AM

## 2014-01-20 NOTE — Progress Notes (Signed)
Subjective: I spoke with [name] husband.  For awhile, she has not felt well.  She had nausea and often wanted to sleep.  However, she did not seem too ill until the day before admission.  She developed diarrhea and vomiting.  He called for an ambulance when her situation worsened.    Objective: Vital signs in last 24 hours: Temp:  [93.4 F (34.1 C)-103.1 F (39.5 C)] 95.5 F (35.3 C) (04/22 0400) Pulse Rate:  [34-206] 38 (04/22 0400) Resp:  [16-58] 38 (04/22 0400) BP: (34-180)/(10-138) 64/29 mmHg (04/22 0351) SpO2:  [38 %-100 %] 52 % (04/22 0400) Arterial Line BP: (64-155)/(25-64) 64/29 mmHg (04/22 0400) FiO2 (%):  [100 %] 100 % (04/22 0351) Weight:  [45.4 kg (100 lb 1.4 oz)] 45.4 kg (100 lb 1.4 oz) (04/21 1226) Weight change:    Intake/Output from previous day: 04/21 0701 - 04/22 0700 In: 10464.3 [I.V.:7510.3; Blood:504; IV UXNATFTDD:2202] Out: 3712 [Urine:50] Intake/Output this shift: Total I/O In: 6712.1 [I.V.:4408.1; Blood:504; Other:1400; IV Piggyback:400] Out: 5427 [Urine:50; Other:3662]  General: Unresponsive. Eyes:  periorbital puffiness. Neck: Supple, trachea midline. Cardiovascular: Rapid rate, weak radial pulses. Respiratory: Coarse breath sounds. Gastrointestinal:  No bowel sounds, moderate abdominal distention. Mental status: As above. Hematologic/lymphatic: No palpable cervical adenopathy. Skin:  Cool skin over extremities without necrosis.    Lab Results:  Recent Labs  01/12/2014 1809 01/05/2014 2154  WBC 20.2* 24.8*  HGB 8.8* 10.8*  HCT 28.1* 33.0*  PLT 203 195   BMET  Recent Labs  01/01/2014 1900 01/05/2014 2155  NA 158* 163*  K 2.6* 3.0*  CL 113* 104  CO2 23 30  GLUCOSE 49* 115*  BUN 17 15  CREATININE 0.84 0.78  CALCIUM 7.7* 6.4*   01/02/2014:   Free T4 4.06 ng/dL (reference range 0.8 - 1.8) Free T3 9.7 pg/mL (reference range 2.3 - 4.2) HCG <1 mIU/mL Alkaline phosphatase 156 AST 52 ALT 23  Medications: I have reviewed the patient's  current medications.  Assessment/Plan: 1.  Thyroid storm.  2.  Elevated liver enzymes.  The patient's husband seems to understand the severity of her illness.  He asked me to provide whatever care we can offer to help her recover.  I spoke with Georgann Housekeeper from the critical care service.  I welcome the opportunity to speak with the critical care attending and the nephrologist this morning.   Recommendations: 1.  Resume methimazole, as previously ordered. 2.  Continue Lugol's solution. 3.  Continue intravenous hydrocortisone (but may be titrated down at discretion of critical care team). 4.  Start cholestyramine 4 grams per gastric tube up to 4 times a day (this should not interfere with the absorption of any of her current oral medicines apart from acetaminophen; but avoid within four hours of medicines for which absorption is reduced by cholestyramine; may cause constipation). 5.  Consider plasmapheresis, plasma exchange, charcoal hemoperfusion, or resin hemoperfusion since those measures may rapidly reduce thyroid hormone levels. 6.  If other measures are not sufficient, consider addition of lithium carbonate 300 milligrams per gastric tube every 8 hours with lithium blood level measured daily and dose adjusted to concentration of 0.6 to 1.0 mEq/L.   7.  Avoid salicylates--since salicylates can displace T4 and T3 from their binding proteins.    I have ordered daily measurements of Free T4 and Free T3.  Suppressed TSH may take several weeks to recover even after thyrotoxicosis has been resolved.   I also ordered thyroid peroxidase antibodies and thyroid stimulating immunoglobulin  to check for Graves' disease/thyroid autoimmunity.     LOS: 1 day   Delrae Rend 02-04-14, 5:01 AM

## 2014-01-20 NOTE — Clinical Social Work Note (Signed)
CSW contacted interpreting services to provide an interpreter for medical staff/family.  Scheduler received the request and is currently contacting interpreters to determine availability.  CSW awaiting return call.  Nonnie Done, [place] [phone]  Clinical Social Work

## 2014-01-20 NOTE — Progress Notes (Signed)
CRITICAL VALUE ALERT  Critical value received:  Positive blood cultures G+ Cocci Chains Aerobic Bottles both culture sets  Date of notification:  01/26/14  Time of notification:  0605  Critical value read back:yes  Nurse who received alert:  York Pellant RN  MD notified (1st page):  D. McQuaid  Time of first page:  0608  Time MD responded:  [phone]

## 2014-01-20 NOTE — Progress Notes (Signed)
CRITICAL VALUE ALERT  Critical value received:  Potassium 2.6  Date of notification:  12/22/2013   Time of notification:  2152  Critical value read back:yes  Nurse who received alert:  Corrinne Eagle RN  MD notified (1st page):  Dr. Redmond Pulling  Time of first page:   Reported value to her in person on unit at 2153  MD notified (2nd page):NA  Time of second page:  Responding MD:  Redmond Pulling  Time MD responded:  2153

## 2014-01-20 NOTE — Progress Notes (Signed)
[facility]  Called to bedside this morning to assess [name] Worsening shock overnight Resumed methimazole Acidosis improved but overall condition worse No neurologic improvement  Filed Vitals:   12/29/2013 1025 01/13/2014 1027 12/24/2013 1030 01/04/2014 1200  BP:      Pulse: 35 35    Temp: 97.1 F (36.2 C) 97.3 F (36.3 C) 97.2 F (36.2 C)   TempSrc:      Resp: 19 7 0   Height:      Weight:    56 kg (123 lb 7.3 oz)  SpO2: 100% 100%     Gen: on vent HEENT: NCAT, pupils dilated, nonreactive PULM: crackles bilat CV: Tachy, regular AB: BS infrequent, soft but distended Ext: warm edema noted Neuro: GCS 3  Impression: 1) Thyroid storm 2) Acute respiratory failure 3) Shock: septic, high output shock 4) GPC bacteremia 5) Acute kidney injury 6) Severe anoxic brain injury  Family updated at bedside at length with use of translator for 45 minutes.  Informed them that she was in refractory shock and that there was no medicine available to improve her condition.  They had many questions about the thyroid problem.  Advised them that despite the fact we gave her the medicine for the thyroid problem, the majority of the tissue damage (lung, brain, kidney) had been done on 4/21 during her cardiac arrest event.  Explained that we could no longer perform CPR.    During our conversation the patient passed with asystolic arrest at 9326 AM  CC time 50 minutes  Roselie Awkward, MD [facility] Pager: [phone] Cell: [phone] If no response, call [phone]

## 2014-01-20 NOTE — Progress Notes (Signed)
CRITICAL VALUE ALERT  Critical value received:  Calcium 6.2  Date of notification:  01/13/2014  Time of notification:  0637  Critical value read back:yes  Nurse who received alert:  York Pellant RN  MD notified (1st page): Dr Redmond Pulling  Time of first page:  [phone]  Responding MD:  Dr Redmond Pulling  Time MD responded:  [phone]

## 2014-01-20 NOTE — Progress Notes (Signed)
Chaplain followed up with the pt and family.  Pt had already expired when chaplain arrived.  Family has their own faith representation present, and translator is here for family.  Chaplain services will remain available should the need for spiritual care arise.

## 2014-01-20 NOTE — Progress Notes (Signed)
Patient ID: [name], female   DOB: 05/04/1981, 33 y.o.   MRN: [id number] S:Events of last 12 hours noted.  Pt intubated and unresponsive O:BP 49/27  Pulse 42  Temp(Src) 96.8 F (36 C) (Core (Comment))  Resp 42  Ht 4\' 10"  (1.473 m)  Wt 56 kg (123 lb 7.3 oz)  BMI 25.81 kg/m2  SpO2 74%  Intake/Output Summary (Last 24 hours) at 01/29/2014 0941 Last data filed at 01-29-14 0900  Gross per 24 hour  Intake 13008.75 ml  Output   6107 ml  Net 6901.75 ml   Intake/Output: I/O last 3 completed shifts: In: 12007 [I.V.:9003; Blood:504; Other:1400; IV Piggyback:1100] Out: 5148 [Urine:50; HQION:6295]  Intake/Output this shift:  Total I/O In: 1001.8 [I.V.:1001.8] Out: 959 [Other:959] Weight change:  MWU:XLKGM, small, vietnamese female intubated and sedated WNU:UVOZD Resp:tachypnic, no rales Abd:+bs, soft Ext:no edema   Recent Labs Lab 01/15/2014 1032 01/17/2014 1142 01/09/2014 1511 01/17/2014 1800 12/27/2013 1900 01/09/2014 2155 01-29-14 0500 2014/01/29 0548  NA 142 150* 148*  --  158* 163* 146  --   K 3.3* <2.2* <2.2*  --  2.6* 3.0* 4.8  --   CL 108 113* 112  --  113* 104 86*  --   CO2 <7* 10* 12*  --  23 30 21   --   GLUCOSE 82 58* 88  --  49* 115* 131*  --   BUN 16 15 16   --  17 15 11   --   CREATININE 0.55 0.55 0.57  --  0.84 0.78 0.63  --   ALBUMIN 2.2* 1.6*  --   --   --   --  1.6*  --   CALCIUM 8.2* 8.4 8.4  --  7.7* 6.4* 6.2*  --   PHOS 5.9* 8.5*  --  5.8*  --   --   --  5.0*  AST 52* 476*  --   --   --   --  2562*  --   ALT 23 173*  --   --   --   --  613*  --    Liver Function Tests:  Recent Labs Lab 01/02/2014 1032 12/28/2013 1142 2014-01-29 0500  AST 52* 476* 2562*  ALT 23 173* 613*  ALKPHOS 156* 179* 168*  BILITOT 1.3* 1.4* 3.7*  PROT 5.3* 4.1* 4.1*  ALBUMIN 2.2* 1.6* 1.6*   No results found for this basename: LIPASE, AMYLASE,  in the last 168 hours No results found for this basename: AMMONIA,  in the last 168 hours CBC:  Recent Labs Lab 12/28/2013 1032  01/16/2014 1809  01/17/2014 2154 01-29-14 0500  WBC 22.1*  --  20.2* 24.8* 33.2*  NEUTROABS 18.3*  --   --  22.6* 30.5*  HGB 10.6*  --  8.8* 10.8* 12.4  HCT 34.5*  --  28.1* 33.0* 37.4  MCV 80.2  --  78.5 79.3 78.6  PLT 193  < > 203 195 PLATELET CLUMPS NOTED ON SMEAR, COUNT APPEARS ADEQUATE  < > = values in this interval not displayed. Cardiac Enzymes:  Recent Labs Lab 12/27/2013 1032 01/18/2014 1146  TROPONINI <0.30 0.96*   CBG:  Recent Labs Lab 01/02/2014 2157 01/29/2014 0027 2014/01/29 0055 01-29-14 0425 2014-01-29 0758  GLUCAP 87 62* 124* 92 76    Iron Studies: No results found for this basename: IRON, TIBC, TRANSFERRIN, FERRITIN,  in the last 72 hours Studies/Results: Dg Chest Port 1 View  01/29/14   CLINICAL DATA:  f/u CHF  EXAM: PORTABLE CHEST - 1  VIEW  COMPARISON:  DG CHEST 1V PORT dated 12/23/2013  FINDINGS: Low lung volumes. Cardiac silhouette within normal limits. Endotracheal tube is appreciated tip 1.5 cm above the carina. NG tube appreciated, tip not on the view of this study. Left-sided internal jugular central venous catheter tip at level superior vena cava. Right IJ sheath tip at the level of the superior vena cava. There is prominence of interstitial markings and peribronchial cuffing. Bilateral pulmonary opacities are appreciated with increased conspicuity compared to prior. No acute osseous abnormalities.  IMPRESSION: Endotracheal tube tip 1.5 cm above the carina retraction of 1.5-2 cm recommended.  Remaining support lines and tubes adequately positioned.  Increased conspicuity of the bilateral airspace disease.   Electronically Signed   By: Margaree Mackintosh M.D.   On: 01/08/2014 07:59   Dg Chest Port 1 View  12/26/2013   CLINICAL DATA:  Dialysis catheter placement.  Cancer.  EXAM: PORTABLE CHEST - 1 VIEW  COMPARISON:  DG CHEST 1V PORT dated 12/27/2013  FINDINGS: Lateral right midlung nodule consistent with the given history of cancer. New right internal jugular dialysis catheter place with  its tip in the mid SVC and no pneumothorax. Tubular device is otherwise stable. Airspace disease throughout the right lung has improved. It is stable throughout the left lung and is asymmetrically worse than that of the right. Upper normal heart size.  IMPRESSION: New right internal jugular dialysis catheter with its tip in the mid SVC and no pneumothorax.  Bilateral airspace disease is stable on the left but improved on the right.   Electronically Signed   By: Maryclare Bean M.D.   On: 01/05/2014 18:51   Dg Chest Port 1 View  12/28/2013   CLINICAL DATA:  Central line placement  EXAM: PORTABLE CHEST - 1 VIEW  COMPARISON:  DG CHEST 1V PORT dated 01/13/2014; US FETAL NUCHAL TRANSLUCENCY MEASUREMENT dated 11/01/2012; SP REMOVAL TUN ACCESS W/ PORT W/O FL dated 06/07/2012  FINDINGS: Endotracheal tube again identified with tip just above the carinal. Left internal jugular central line has been placed with tip over the cavoatrial junction. An NG tube crosses the gastroesophageal junction. No pneumothorax. There is extensive hazy opacity over the lower 2/3 of both lungs, new on the right and mildly worse on the left. Costophrenic angle blunting bilaterally is also present.  IMPRESSION: Lines and tubes as described. Bilateral hazy opacities. Pulmonary edema suspected.   Electronically Signed   By: Skipper Cliche M.D.   On: 12/27/2013 16:21   Dg Chest Portable 1 View  01/19/2014   CLINICAL DATA:  Intubation  EXAM: PORTABLE CHEST - 1 VIEW  COMPARISON:  11/17/2011  FINDINGS: Endotracheal tube is 15 mm above the carina. Numerous EKG leads overlying the chest.  Left lower lobe airspace disease consistent with pneumonia or atelectasis. No effusion. Right lung is clear.  IMPRESSION: Extensive left lower lobe airspace disease which may represent pneumonia or atelectasis. Endotracheal tube 15 mm above the carina.   Electronically Signed   By: Franchot Gallo M.D.   On: 01/15/2014 11:29   Dg Abd Portable 1v  01/08/2014   CLINICAL  DATA:  Abdominal distention  EXAM: PORTABLE ABDOMEN - 1 VIEW  COMPARISON:  None.  FINDINGS: NG tube tip is in the antrum. No disproportionate dilatation of bowel. No obvious free intraperitoneal gas. Bilateral groin vascular catheters are in place.  IMPRESSION: Nonobstructive bowel gas pattern.  NG tube.   Electronically Signed   By: Maryclare Bean M.D.   On: 01/05/2014 18:52   .  antiseptic oral rinse  15 mL Mouth Rinse QID  . chlorhexidine  15 mL Mouth Rinse BID  . Chlorhexidine Gluconate Cloth  6 each Topical Q0600  . hydrocortisone sod succinate (SOLU-CORTEF) inj  100 mg Intravenous Q8H  . Iodine Strong (Lugols)  0.2 mL Oral TID  . levofloxacin (LEVAQUIN) IV  250 mg Intravenous Q24H  . irrigation builder   Topical Q6H  . mupirocin ointment  1 application Nasal BID  . piperacillin-tazobactam  3.375 g Intravenous 4 times per day  . vancomycin  500 mg Intravenous Q24H    BMET    Component Value Date/Time   NA 146 01/08/2014 0500   NA 140 11/10/2013 0802   K 4.8 01/10/2014 0500   K 3.5 11/10/2013 0802   CL 86* 01/10/2014 0500   CL 107 12/06/2012 0851   CO2 21 12/28/2013 0500   CO2 21* 11/10/2013 0802   GLUCOSE 131* 01/12/2014 0500   GLUCOSE 99 11/10/2013 0802   GLUCOSE 143* 12/06/2012 0851   BUN 11 01/02/2014 0500   BUN 12.2 11/10/2013 0802   CREATININE 0.63 12/27/2013 0500   CREATININE 0.5* 11/10/2013 0802   CALCIUM 6.2* 01/19/2014 0500   CALCIUM 10.5* 11/10/2013 0802   GFRNONAA >90 01/16/2014 0500   GFRAA >90 12/28/2013 0500   CBC    Component Value Date/Time   WBC 33.2* 01/10/2014 0500   WBC 5.6 11/10/2013 0802   RBC 4.76 12/21/2013 0500   RBC 4.93 11/10/2013 0802   HGB 12.4 12/21/2013 0500   HGB 11.9 11/10/2013 0802   HCT 37.4 01/19/2014 0500   HCT 37.3 11/10/2013 0802   PLT PLATELET CLUMPS NOTED ON SMEAR, COUNT APPEARS ADEQUATE 12/27/2013 0500   PLT 239 11/10/2013 0802   MCV 78.6 01/10/2014 0500   MCV 75.7* 11/10/2013 0802   MCH 26.1 12/27/2013 0500   MCH 24.1* 11/10/2013 0802   MCHC 33.2  01/06/2014 0500   MCHC 31.8 11/10/2013 0802   RDW 15.4 01/01/2014 0500   RDW 15.3* 11/10/2013 0802   LYMPHSABS 1.7 12/21/2013 0500   LYMPHSABS 2.0 11/10/2013 0802   MONOABS 1.0 12/31/2013 0500   MONOABS 0.4 11/10/2013 0802   EOSABS 0.0 01/09/2014 0500   EOSABS 0.1 11/10/2013 0802   BASOSABS 0.0 12/23/2013 0500   BASOSABS 0.0 11/10/2013 0802    Assessment/Plan:  1. Metabolic acidosis- pH and CO2 stable on CVVHD however BP has not improved.  Agree with Dr. Lake Bells that her BP response was more likely due to hypertonic nature of bicarb and not due to its effect on pH.  Prognosis is poor, will cont with current level of care until family can be at her bedside 2. Hypernatremia- due to multiple boluses of bicarb, no concern over rapid correction as this was acute and iatrogenic 3. 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 4. VDRF s/p cardiac arrest- very poor prognosis despite aggressive care  [place]

## 2014-01-20 DEATH — deceased

## 2014-02-16 ENCOUNTER — Other Ambulatory Visit: Payer: BC Managed Care – PPO

## 2014-05-11 ENCOUNTER — Other Ambulatory Visit: Payer: BC Managed Care – PPO

## 2014-05-18 ENCOUNTER — Ambulatory Visit: Payer: BC Managed Care – PPO | Admitting: Physician Assistant

## 2014-07-24 ENCOUNTER — Encounter (HOSPITAL_COMMUNITY): Payer: Self-pay | Admitting: Emergency Medicine
